# Patient Record
Sex: Male | Born: 1945 | Hispanic: No | Marital: Married | Smoking: Never smoker
Health system: Southern US, Community
[De-identification: ages and names within clinical notes are randomized; demographics above are authoritative.]

## PROBLEM LIST (undated history)

## (undated) DIAGNOSIS — Q5564 Hidden penis: Secondary | ICD-10-CM

## (undated) DIAGNOSIS — M1990 Unspecified osteoarthritis, unspecified site: Secondary | ICD-10-CM

## (undated) DIAGNOSIS — I4891 Unspecified atrial fibrillation: Secondary | ICD-10-CM

## (undated) DIAGNOSIS — G5600 Carpal tunnel syndrome, unspecified upper limb: Secondary | ICD-10-CM

## (undated) DIAGNOSIS — M109 Gout, unspecified: Secondary | ICD-10-CM

## (undated) DIAGNOSIS — H548 Legal blindness, as defined in USA: Secondary | ICD-10-CM

## (undated) DIAGNOSIS — E7800 Pure hypercholesterolemia, unspecified: Secondary | ICD-10-CM

## (undated) DIAGNOSIS — K219 Gastro-esophageal reflux disease without esophagitis: Secondary | ICD-10-CM

## (undated) DIAGNOSIS — F32A Depression, unspecified: Secondary | ICD-10-CM

## (undated) DIAGNOSIS — E669 Obesity, unspecified: Secondary | ICD-10-CM

## (undated) DIAGNOSIS — F329 Major depressive disorder, single episode, unspecified: Secondary | ICD-10-CM

## (undated) DIAGNOSIS — I10 Essential (primary) hypertension: Secondary | ICD-10-CM

## (undated) HISTORY — PX: TONSILLECTOMY: SUR1361

## (undated) HISTORY — DX: Unspecified atrial fibrillation: I48.91

## (undated) HISTORY — DX: Carpal tunnel syndrome, unspecified upper limb: G56.00

## (undated) HISTORY — PX: INGUINAL HERNIA REPAIR: SHX194

## (undated) HISTORY — DX: Essential (primary) hypertension: I10

## (undated) HISTORY — DX: Major depressive disorder, single episode, unspecified: F32.9

## (undated) HISTORY — DX: Gout, unspecified: M10.9

## (undated) HISTORY — DX: Unspecified osteoarthritis, unspecified site: M19.90

## (undated) HISTORY — DX: Pure hypercholesterolemia, unspecified: E78.00

## (undated) HISTORY — DX: Legal blindness, as defined in USA: H54.8

## (undated) HISTORY — DX: Depression, unspecified: F32.A

## (undated) HISTORY — DX: Gastro-esophageal reflux disease without esophagitis: K21.9

## (undated) HISTORY — DX: Obesity, unspecified: E66.9

---

## 1999-02-20 ENCOUNTER — Ambulatory Visit (HOSPITAL_BASED_OUTPATIENT_CLINIC_OR_DEPARTMENT_OTHER): Admission: RE | Admit: 1999-02-20 | Discharge: 1999-02-20 | Payer: Self-pay

## 1999-02-27 ENCOUNTER — Ambulatory Visit (HOSPITAL_COMMUNITY): Admission: RE | Admit: 1999-02-27 | Discharge: 1999-02-27 | Payer: Self-pay

## 2005-07-10 ENCOUNTER — Encounter: Admission: RE | Admit: 2005-07-10 | Discharge: 2005-07-10 | Payer: Self-pay | Admitting: Family Medicine

## 2005-07-10 ENCOUNTER — Inpatient Hospital Stay (HOSPITAL_COMMUNITY): Admission: EM | Admit: 2005-07-10 | Discharge: 2005-07-27 | Payer: Self-pay | Admitting: Emergency Medicine

## 2005-07-12 ENCOUNTER — Ambulatory Visit: Payer: Self-pay | Admitting: Pulmonary Disease

## 2006-12-06 ENCOUNTER — Observation Stay (HOSPITAL_COMMUNITY): Admission: EM | Admit: 2006-12-06 | Discharge: 2006-12-07 | Payer: Self-pay | Admitting: Emergency Medicine

## 2013-06-20 ENCOUNTER — Encounter: Payer: Self-pay | Admitting: Interventional Cardiology

## 2013-06-20 ENCOUNTER — Encounter: Payer: Self-pay | Admitting: *Deleted

## 2013-06-24 ENCOUNTER — Encounter: Payer: Self-pay | Admitting: Interventional Cardiology

## 2013-06-24 ENCOUNTER — Encounter (INDEPENDENT_AMBULATORY_CARE_PROVIDER_SITE_OTHER): Payer: Self-pay

## 2013-06-24 ENCOUNTER — Ambulatory Visit (INDEPENDENT_AMBULATORY_CARE_PROVIDER_SITE_OTHER): Payer: Medicare Other | Admitting: Interventional Cardiology

## 2013-06-24 VITALS — BP 160/80 | HR 89 | Ht 67.0 in | Wt 326.0 lb

## 2013-06-24 DIAGNOSIS — Z7901 Long term (current) use of anticoagulants: Secondary | ICD-10-CM

## 2013-06-24 DIAGNOSIS — E785 Hyperlipidemia, unspecified: Secondary | ICD-10-CM

## 2013-06-24 DIAGNOSIS — I10 Essential (primary) hypertension: Secondary | ICD-10-CM | POA: Insufficient documentation

## 2013-06-24 DIAGNOSIS — I4891 Unspecified atrial fibrillation: Secondary | ICD-10-CM

## 2013-06-24 DIAGNOSIS — I482 Chronic atrial fibrillation, unspecified: Secondary | ICD-10-CM

## 2013-06-24 NOTE — Patient Instructions (Signed)
Your physician recommends that you continue on your current medications as directed. Please refer to the Current Medication list given to you today.  Your physician recommends that you schedule a follow-up appointment in: 1 year with Dr. Smith.   

## 2013-06-24 NOTE — Progress Notes (Signed)
Patient ID: Ryan Barker, male   DOB: 1946-03-12, 67 y.o.   MRN: [id number]    HPI He is accompanied by his wife. He has no complaints. He advocates compliance with his medical regimen. He is very sedentary. Sedentary lifestyle is due to severe arthritis in both knees. He has gained weight since his last office visit. He denies difficulty with sleep. He has had no episodes of syncope, or near-syncope. No medication side effects.  No Known Allergies  Current Outpatient Prescriptions  Medication Sig Dispense Refill  . allopurinol (ZYLOPRIM) 300 MG tablet Take 300 mg by mouth daily.      Marland Kitchen ALPRAZolam (XANAX) 1 MG tablet Take 1 mg by mouth at bedtime as needed for sleep.      Marland Kitchen diltiazem (CARDIZEM) 60 MG tablet Take 2 tablets by mouth 3 (three) times daily.      Marland Kitchen lisinopril (PRINIVIL,ZESTRIL) 40 MG tablet Take 40 mg by mouth daily.      . metoprolol tartrate (LOPRESSOR) 25 MG tablet Take 25 mg by mouth 2 (two) times daily.      . Multiple Vitamins-Minerals (ICAPS MV PO) Take 1 tablet by mouth daily.      Marland Kitchen omeprazole (PRILOSEC) 40 MG capsule Take 40 mg by mouth daily.      . pravastatin (PRAVACHOL) 40 MG tablet Take 1 tablet by mouth daily.      Marland Kitchen torsemide (DEMADEX) 20 MG tablet Take 20 mg by mouth daily.      Marland Kitchen warfarin (COUMADIN) 5 MG tablet Take 5 mg by mouth as directed.       No current facility-administered medications for this visit.    Past Medical History  Diagnosis Date  . Gout   . HTN (hypertension)   . Depressive disorder   . Atrial fibrillation   . Osteoarthritis   . Obesity   . GERD (gastroesophageal reflux disease)   . Hypercholesteremia   . Carpal tunnel syndrome   . Legally blind in left eye, as defined in [place]   . Hypercholesteremia     No past surgical history on file.  No family history on file.  History   Social History  . Marital Status: Married    Spouse Name: N/A    Number of Children: N/A  . Years of Education: N/A   Occupational History  .  Not on file.   Social History Main Topics  . Smoking status: Never Smoker   . Smokeless tobacco: Not on file  . Alcohol Use: No  . Drug Use: No  . Sexual Activity: Not on file   Other Topics Concern  . Not on file   Social History Narrative  . No narrative on file    ROS: Denies chest pain, syncope, angina, orthopnea, bleeding, and, transient neurological symptoms. Does have chronic lower extremity swelling, improved compared to prior evaluations.  PHYSICAL EXAM BP 160/80  Pulse 89  Ht 5\' 7"  (1.702 m)  Wt 326 lb (147.873 kg)  BMI 51.05 kg/m2  Sitting a wheelchair. No acute distress. Skin is clear. No cyanosis noted. HEENT exam reveals ptosis of the left lid. Lungs are clear to auscultation and percussion Cardiac exam reveals an irregularly irregular rhythm. No murmur or gallop is heard. Marked abdominal obesity. No tenderness. Bowel sounds are normal. 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 Left lid ptosis  EKG: Atrial fibrillation, with controlled ventricular response, right bundle branch block, left anterior hemiblock  ASSESSMENT AND PLAN  1. Chronic atrial fibrillation, with good.good rate control.  2. Chronic anticoagulation therapy for stroke prevention. This is followed by his primary physician, Dr. Manus Gunning  3. Hypertension, controlled.  4. Hyperlipidemia.  5. Morbid obesity, and near immobility, do to osteoarthritis of both knees  In summary, the patient has no acute abnormalities. He has not had bleeding complications. The atrial fibrillation. Rate control is adequate. His major comorbidity is obesity. Have recommended caloric control and physical activity as much as possible.

## 2013-07-16 ENCOUNTER — Other Ambulatory Visit: Payer: Self-pay

## 2014-06-06 ENCOUNTER — Encounter: Payer: Self-pay | Admitting: *Deleted

## 2014-06-25 ENCOUNTER — Other Ambulatory Visit: Payer: Self-pay

## 2014-06-29 ENCOUNTER — Encounter: Payer: Self-pay | Admitting: Interventional Cardiology

## 2014-06-29 ENCOUNTER — Ambulatory Visit (INDEPENDENT_AMBULATORY_CARE_PROVIDER_SITE_OTHER): Payer: Medicare Other | Admitting: Interventional Cardiology

## 2014-06-29 VITALS — BP 150/78 | HR 69 | Ht 68.0 in | Wt 314.0 lb

## 2014-06-29 DIAGNOSIS — Z7901 Long term (current) use of anticoagulants: Secondary | ICD-10-CM

## 2014-06-29 DIAGNOSIS — I482 Chronic atrial fibrillation, unspecified: Secondary | ICD-10-CM

## 2014-06-29 DIAGNOSIS — I10 Essential (primary) hypertension: Secondary | ICD-10-CM

## 2014-06-29 NOTE — Progress Notes (Signed)
Patient ID: Ryan Barker Defeo, male   DOB: November 11, 1945, 68 y.o.   MRN: [id number]    [address] [place], [place]  [address] Phone: [phone] Fax:  [phone]  Date:  06/29/2014   ID:  Ryan ShihWilliam Barker Barker, DOB November 11, 1945, MRN [id number]  PCP:  Thora LanceEHINGER,ROBERT R, MD   ASSESSMENT:  1. Chronic atrial fibrillation 2. Anticoagulation therapy without bleeding 3. Hypertension 4. Morbid obesity  PLAN:  1. Change to diltiazem dosing from 3 tablets twice per day to 2 tablets 3 times per day as listed in the mid list. 2. [facility] 3. Call if syncope, palpitations, dyspnea, or chest pain.   SUBJECTIVE: Ryan Barker is a 68 y.o. male is becoming increasingly immobile related to obesity and arthritis. No falls or head trauma. Denies syncope. Lower extremity swelling has resolved. He denies orthopnea. No angina.   Wt Readings from Last 3 Encounters:  06/29/14 314 lb (142.429 kg)  06/24/13 326 lb (147.873 kg)     Past Medical History  Diagnosis Date  . Gout   . HTN (hypertension)   . Depressive disorder   . Atrial fibrillation   . Osteoarthritis   . Obesity   . GERD (gastroesophageal reflux disease)   . Hypercholesteremia   . Carpal tunnel syndrome   . Legally blind in left eye, as defined in [place]   . Hypercholesteremia     Current Outpatient Prescriptions  Medication Sig Dispense Refill  . acetaminophen (TYLENOL) 650 MG CR tablet Take 1,300 mg by mouth every 8 (eight) hours as needed for pain.      Marland Kitchen. allopurinol (ZYLOPRIM) 300 MG tablet Take 300 mg by mouth daily.      Marland Kitchen. ALPRAZolam (XANAX) 1 MG tablet Take 1 mg by mouth at bedtime as needed for sleep.      Marland Kitchen. diltiazem (CARDIZEM) 60 MG tablet Take 2 tablets by mouth 3 (three) times daily.      Marland Kitchen. HYDROcodone-acetaminophen (NORCO/VICODIN) 5-325 MG per tablet       . lisinopril (PRINIVIL,ZESTRIL) 40 MG tablet Take 40 mg by mouth daily.      . meloxicam (MOBIC) 15 MG tablet Take 15 mg by  mouth daily.      . metoprolol tartrate (LOPRESSOR) 25 MG tablet Take 25 mg by mouth 2 (two) times daily.      Marland Kitchen. omeprazole (PRILOSEC) 40 MG capsule Take 40 mg by mouth daily.      . pravastatin (PRAVACHOL) 40 MG tablet Take 1 tablet by mouth daily.      Marland Kitchen. torsemide (DEMADEX) 20 MG tablet Take 20 mg by mouth daily.      Marland Kitchen. warfarin (COUMADIN) 5 MG tablet Take 5 mg by mouth as directed.       No current facility-administered medications for this visit.    Allergies:   No Known Allergies  Social History:  The patient  reports that he has never smoked. He does not have any smokeless tobacco history on file. He reports that he does not drink alcohol or use illicit drugs.   ROS:  Please see the history of present illness.   No transient neurological complaints   All other systems reviewed and negative.   OBJECTIVE: VS:  BP 150/78  Pulse 69  Ht 5\' 8"  (1.727 m)  Wt 314 lb (142.429 kg)  BMI 47.75 kg/m2 Well nourished, well developed, in no acute distress, morbid obesity HEENT: normal Neck: JVD flat. Carotid bruit absent  Cardiac:  normal S1, S2; IIRR; no murmur Lungs:  clear to auscultation bilaterally, no wheezing, rhonchi or rales Abd: soft, nontender, no hepatomegaly Ext: Edema absent. He does have chronic lichenification of the skin. Pulses 2+ Skin: warm and dry Neuro:  CNs 2-12 intact, no focal abnormalities noted  EKG:  Atrial fibrillation with right bundle branch block and left axis deviation unchanged from prior tracings       Signed, Darci NeedleHenry Barker. B. Tranquilino Fischler III, MD 06/29/2014 1:49 PM

## 2014-06-29 NOTE — Patient Instructions (Signed)
Your physician recommends that you continue on your current medications as directed. Please refer to the Current Medication list given to you today.  Your physician wants you to follow-up in: 1 year with Dr.Smith You will receive a reminder letter in the mail two months in advance. If you don't receive a letter, please call our office to schedule the follow-up appointment.  

## 2015-03-07 ENCOUNTER — Other Ambulatory Visit: Payer: Self-pay

## 2015-07-05 ENCOUNTER — Encounter: Payer: Self-pay | Admitting: Cardiology

## 2015-07-05 ENCOUNTER — Ambulatory Visit (INDEPENDENT_AMBULATORY_CARE_PROVIDER_SITE_OTHER): Payer: Medicare Other | Admitting: Cardiology

## 2015-07-05 VITALS — BP 142/84 | HR 71 | Ht 68.0 in | Wt 308.8 lb

## 2015-07-05 DIAGNOSIS — I10 Essential (primary) hypertension: Secondary | ICD-10-CM

## 2015-07-05 DIAGNOSIS — I482 Chronic atrial fibrillation, unspecified: Secondary | ICD-10-CM

## 2015-07-05 NOTE — Patient Instructions (Addendum)
Medication Instructions:  Your physician recommends that you continue on your current medications as directed. Please refer to the Current Medication list given to you today.   Labwork: None ordered  Testing/Procedures: None ordered  Follow-Up: Your physician wants you to follow-up in: 1 YEAR WITH DR. Katrinka BlazingSMITH.  You will receive a reminder letter in the mail two months in advance. If you don't receive a letter, please call our office to schedule the follow-up appointment.    Any Other Special Instructions Will Be Listed Below (If Applicable). 1.  START walking:  1st week do 1 lap a day                                   2nd week do 2 laps a day                                   3rd week do 4 laps a day                                   4th week do 6 laps a day and continue on til you can reach 10 laps a day                                    TV SHOW:  BETTER LATER THAN NEVER                                   If you need a refill on your cardiac medications before your next appointment, please call your pharmacy.

## 2015-07-05 NOTE — Progress Notes (Signed)
Cardiology Office Note   Date:  07/05/2015   ID:  Ryan Barker, Ryan Barker December 10, 1945, MRN [id number]  PCP:  Thora Lance, MD  Cardiologist:  Dr. Katrinka Blazing    Chief Complaint  Patient presents with  . Atrial Fibrillation    no pain      History of Present Illness: Ryan Barker is a 69 y.o. male who presents for chronic a fib and hx obesity, HTN, hyperlipidemia.   Today he denies chest pain,  No SOB.  He is not aware of his heart rate.   He does not exercise, basically  Watches TV all day. He has lost some wt.  He is only eating one meal a day.  We discussed eating at least a small meal for breakfast.  He does use salt shaker, discussed with pt and his wife to use less salt.        Past Medical History  Diagnosis Date  . Gout   . HTN (hypertension)   . Depressive disorder   . Atrial fibrillation (HCC)   . Osteoarthritis   . Obesity   . GERD (gastroesophageal reflux disease)   . Hypercholesteremia   . Carpal tunnel syndrome   . Legally blind in left eye, as defined in [place]   . Hypercholesteremia     History reviewed. No pertinent past surgical history.   Current Outpatient Prescriptions  Medication Sig Dispense Refill  . acetaminophen (TYLENOL) 650 MG CR tablet Take 1,300 mg by mouth every 8 (eight) hours as needed for pain.    Marland Kitchen allopurinol (ZYLOPRIM) 300 MG tablet Take 300 mg by mouth daily.    Marland Kitchen ALPRAZolam (XANAX) 1 MG tablet Take 1 mg by mouth at bedtime as needed for sleep.    Marland Kitchen diltiazem (CARDIZEM) 60 MG tablet Take 2 tablets by mouth 3 (three) times daily.    . Glucosamine-Chondroitin (MOVE FREE PO) Take 1 tablet by mouth daily.    Marland Kitchen lisinopril (PRINIVIL,ZESTRIL) 40 MG tablet Take 40 mg by mouth daily.    . meloxicam (MOBIC) 15 MG tablet Take 15 mg by mouth daily.    . metoprolol tartrate (LOPRESSOR) 25 MG tablet Take 25 mg by mouth 2 (two) times daily.    Marland Kitchen omeprazole (PRILOSEC) 40 MG capsule Take 40 mg by mouth daily.    . pravastatin (PRAVACHOL) 40 MG  tablet Take 40 mg by mouth daily.    Marland Kitchen torsemide (DEMADEX) 20 MG tablet Take 20 mg by mouth daily.    Marland Kitchen warfarin (COUMADIN) 5 MG tablet Take 5 mg by mouth as directed.     No current facility-administered medications for this visit.    Allergies:   Review of patient's allergies indicates no known allergies.    Social History:  The patient  reports that he has never smoked. He does not have any smokeless tobacco history on file. He reports that he does not drink alcohol or use illicit drugs.   Family History:  The patient's family history is not on file.  Parents are deceased.  ROS:  General:no colds or fevers, gradual weight loss Skin:no rashes or ulcers HEENT:no blurred vision, no congestion CV:see HPI PUL:see HPI GI:no diarrhea constipation or melena, no indigestion- had one episode of bright blood in his stool none since GU:no hematuria, no dysuria MS:+ joint pain knees that is why difficult to walk, no claudication Neuro:no syncope, no lightheadedness Endo:no diabetes, no thyroid disease  Wt Readings from Last 3 Encounters:  07/05/15 308 lb 12.8 oz (  140.071 kg)  06/29/14 314 lb (142.429 kg)  06/24/13 326 lb (147.873 kg)     PHYSICAL EXAM: VS:  BP 142/84 mmHg  Pulse 71  Ht 5\' 8"  (1.727 m)  Wt 308 lb 12.8 oz (140.071 kg)  BMI 46.96 kg/m2 , BMI Body mass index is 46.96 kg/(m^2). General:Pleasant affect, NAD Skin:Warm and dry, brisk capillary refill HEENT:normocephalic, sclera clear, mucus membranes moist Neck:supple, no JVD, no bruits  Heart:S1S2 RRR without murmur, gallup, rub or click Lungs:clear without rales, rhonchi, or wheezes JXB:JYNWAbd:soft, non tender, + BS, do not palpate liver spleen or masses Ext:1+ lower ext edema,  2+ radial pulses Neuro:alert and oriented, MAE, follows commands, + facial symmetry    EKG:  EKG is ordered today. The ekg ordered today demonstrates a fib, LAD, RBBB no acute changes.    Recent Labs: No results found for requested labs within  last 365 days.    Lipid Panel No results found for: CHOL, TRIG, HDL, CHOLHDL, VLDL, LDLCALC, LDLDIRECT     Other studies Reviewed: Additional studies/ records that were reviewed today include: previous notes.   ASSESSMENT AND PLAN:  1.  Chronic atrial fibrillation stable, no bleeding on coumadin, [facility].  Follow up with Dr. Katrinka BlazingSmith in 1 year.  2. Anticoagulation therapy without bleeding  3. Hypertension controlled  4. Morbid obesity slow wt loss, discussed activity.  Plan to work up to 10 laps in his home.    Current medicines are reviewed with the patient today.  The patient Has no concerns regarding medicines.  The following changes have been made:  See above Labs/ tests ordered today include:see above  Disposition:   FU:  see above  Signed, Leone BrandINGOLD,Coriann Brouhard R, NP  07/05/2015 3:24 PM    [facility] [address], [place], [place]  [address]/ [address] [place], [place] Phone: [phone]; Fax: [phone]  [phone]

## 2015-10-23 ENCOUNTER — Emergency Department (HOSPITAL_COMMUNITY)
Admission: EM | Admit: 2015-10-23 | Discharge: 2015-10-25 | Disposition: A | Discharge status: Home / Self Care | Payer: Medicare Other | Attending: Emergency Medicine | Admitting: Emergency Medicine

## 2015-10-23 ENCOUNTER — Encounter (HOSPITAL_COMMUNITY): Payer: Self-pay | Admitting: *Deleted

## 2015-10-23 DIAGNOSIS — F0280 Dementia in other diseases classified elsewhere without behavioral disturbance: Secondary | ICD-10-CM | POA: Diagnosis present

## 2015-10-23 DIAGNOSIS — M1990 Unspecified osteoarthritis, unspecified site: Secondary | ICD-10-CM | POA: Diagnosis not present

## 2015-10-23 DIAGNOSIS — R5381 Other malaise: Secondary | ICD-10-CM

## 2015-10-23 DIAGNOSIS — E7800 Pure hypercholesterolemia, unspecified: Secondary | ICD-10-CM | POA: Insufficient documentation

## 2015-10-23 DIAGNOSIS — Z7389 Other problems related to life management difficulty: Secondary | ICD-10-CM | POA: Diagnosis not present

## 2015-10-23 DIAGNOSIS — K644 Residual hemorrhoidal skin tags: Secondary | ICD-10-CM | POA: Insufficient documentation

## 2015-10-23 DIAGNOSIS — M109 Gout, unspecified: Secondary | ICD-10-CM | POA: Insufficient documentation

## 2015-10-23 DIAGNOSIS — Q5564 Hidden penis: Secondary | ICD-10-CM | POA: Diagnosis not present

## 2015-10-23 DIAGNOSIS — I499 Cardiac arrhythmia, unspecified: Secondary | ICD-10-CM | POA: Diagnosis not present

## 2015-10-23 DIAGNOSIS — H548 Legal blindness, as defined in USA: Secondary | ICD-10-CM | POA: Diagnosis not present

## 2015-10-23 DIAGNOSIS — K219 Gastro-esophageal reflux disease without esophagitis: Secondary | ICD-10-CM | POA: Insufficient documentation

## 2015-10-23 DIAGNOSIS — R634 Abnormal weight loss: Secondary | ICD-10-CM | POA: Diagnosis not present

## 2015-10-23 DIAGNOSIS — Z79899 Other long term (current) drug therapy: Secondary | ICD-10-CM | POA: Insufficient documentation

## 2015-10-23 DIAGNOSIS — Z7901 Long term (current) use of anticoagulants: Secondary | ICD-10-CM | POA: Insufficient documentation

## 2015-10-23 DIAGNOSIS — I4891 Unspecified atrial fibrillation: Secondary | ICD-10-CM | POA: Insufficient documentation

## 2015-10-23 DIAGNOSIS — I10 Essential (primary) hypertension: Secondary | ICD-10-CM | POA: Insufficient documentation

## 2015-10-23 DIAGNOSIS — F03918 Unspecified dementia, unspecified severity, with other behavioral disturbance: Secondary | ICD-10-CM | POA: Diagnosis present

## 2015-10-23 DIAGNOSIS — F0391 Unspecified dementia with behavioral disturbance: Secondary | ICD-10-CM | POA: Diagnosis present

## 2015-10-23 DIAGNOSIS — E669 Obesity, unspecified: Secondary | ICD-10-CM | POA: Insufficient documentation

## 2015-10-23 DIAGNOSIS — F329 Major depressive disorder, single episode, unspecified: Secondary | ICD-10-CM | POA: Diagnosis not present

## 2015-10-23 DIAGNOSIS — F1310 Sedative, hypnotic or anxiolytic abuse, uncomplicated: Secondary | ICD-10-CM | POA: Insufficient documentation

## 2015-10-23 DIAGNOSIS — F0393 Unspecified dementia, unspecified severity, with mood disturbance: Secondary | ICD-10-CM | POA: Diagnosis present

## 2015-10-23 DIAGNOSIS — R6889 Other general symptoms and signs: Secondary | ICD-10-CM

## 2015-10-23 DIAGNOSIS — Z008 Encounter for other general examination: Secondary | ICD-10-CM | POA: Diagnosis present

## 2015-10-23 DIAGNOSIS — Z791 Long term (current) use of non-steroidal anti-inflammatories (NSAID): Secondary | ICD-10-CM | POA: Insufficient documentation

## 2015-10-23 HISTORY — DX: Hidden penis: Q55.64

## 2015-10-23 LAB — URINE MICROSCOPIC-ADD ON: RBC / HPF: NONE SEEN RBC/hpf (ref 0–5)

## 2015-10-23 LAB — CBC WITH DIFFERENTIAL/PLATELET
BASOS ABS: 0 10*3/uL (ref 0.0–0.1)
BASOS PCT: 0 %
EOS ABS: 0.1 10*3/uL (ref 0.0–0.7)
Eosinophils Relative: 1 %
HCT: 42.7 % (ref 39.0–52.0)
Hemoglobin: 14.4 g/dL (ref 13.0–17.0)
Lymphocytes Relative: 18 %
Lymphs Abs: 1.3 10*3/uL (ref 0.7–4.0)
MCH: 32.6 pg (ref 26.0–34.0)
MCHC: 33.7 g/dL (ref 30.0–36.0)
MCV: 96.6 fL (ref 78.0–100.0)
MONO ABS: 0.6 10*3/uL (ref 0.1–1.0)
MONOS PCT: 8 %
NEUTROS PCT: 73 %
Neutro Abs: 5.4 10*3/uL (ref 1.7–7.7)
Platelets: 207 10*3/uL (ref 150–400)
RBC: 4.42 MIL/uL (ref 4.22–5.81)
RDW: 14.8 % (ref 11.5–15.5)
WBC: 7.4 10*3/uL (ref 4.0–10.5)

## 2015-10-23 LAB — BASIC METABOLIC PANEL
BUN: 21 mg/dL (ref 4–21)
Creatinine: 1.3 mg/dL (ref <=1.3)
GLUCOSE: 106 mg/dL
Sodium: 140 mmol/L (ref 137–147)

## 2015-10-23 LAB — RAPID URINE DRUG SCREEN, HOSP PERFORMED
Amphetamines: NOT DETECTED
BARBITURATES: NOT DETECTED
BENZODIAZEPINES: POSITIVE — AB
COCAINE: NOT DETECTED
Opiates: NOT DETECTED
Tetrahydrocannabinol: NOT DETECTED

## 2015-10-23 LAB — HEPATIC FUNCTION PANEL
ALK PHOS: 90 U/L (ref 25–125)
ALT: 10 U/L (ref 10–40)
AST: 22 U/L (ref 14–40)
BILIRUBIN, TOTAL: 1.3 mg/dL

## 2015-10-23 LAB — COMPREHENSIVE METABOLIC PANEL
ALBUMIN: 4.2 g/dL (ref 3.5–5.0)
ALT: 10 U/L — ABNORMAL LOW (ref 17–63)
AST: 22 U/L (ref 15–41)
Alkaline Phosphatase: 90 U/L (ref 38–126)
Anion gap: 14 (ref 5–15)
BUN: 21 mg/dL — AB (ref 6–20)
CALCIUM: 9.2 mg/dL (ref 8.9–10.3)
CO2: 25 mmol/L (ref 22–32)
CREATININE: 1.27 mg/dL — AB (ref 0.61–1.24)
Chloride: 101 mmol/L (ref 101–111)
GFR calc Af Amer: >60 mL/min (ref >60)
GFR calc non Af Amer: 55 mL/min — ABNORMAL LOW (ref >60)
GLUCOSE: 106 mg/dL — AB (ref 65–99)
Potassium: 3.5 mmol/L (ref 3.5–5.1)
SODIUM: 140 mmol/L (ref 135–145)
TOTAL PROTEIN: 7.1 g/dL (ref 6.5–8.1)
Total Bilirubin: 1.3 mg/dL — ABNORMAL HIGH (ref 0.3–1.2)

## 2015-10-23 LAB — URINALYSIS, ROUTINE W REFLEX MICROSCOPIC
Glucose, UA: NEGATIVE mg/dL
HGB URINE DIPSTICK: NEGATIVE
Ketones, ur: NEGATIVE mg/dL
Nitrite: NEGATIVE
PROTEIN: NEGATIVE mg/dL
SPECIFIC GRAVITY, URINE: 1.021 (ref 1.005–1.030)
pH: 5 (ref 5.0–8.0)

## 2015-10-23 LAB — PROTIME-INR
INR: 2.86 — AB (ref 0.00–1.49)
Prothrombin Time: 29.5 seconds — ABNORMAL HIGH (ref 11.6–15.2)

## 2015-10-23 LAB — ACETAMINOPHEN LEVEL

## 2015-10-23 LAB — POC OCCULT BLOOD, ED: FECAL OCCULT BLD: NEGATIVE

## 2015-10-23 LAB — SALICYLATE LEVEL

## 2015-10-23 LAB — ETHANOL: Alcohol, Ethyl (B): <5 mg/dL (ref <5)

## 2015-10-23 LAB — CBC AND DIFFERENTIAL: WBC: 7.4 10^3/mL

## 2015-10-23 MED ORDER — WARFARIN SODIUM 5 MG PO TABS
5.0000 mg | ORAL_TABLET | ORAL | Status: DC
  Filled 2015-10-23: qty 1

## 2015-10-23 MED ORDER — METOPROLOL TARTRATE 25 MG PO TABS
25.0000 mg | ORAL_TABLET | Freq: Two times a day (BID) | ORAL | Status: DC
  Administered 2015-10-23 – 2015-10-25 (×4): 25 mg via ORAL
  Filled 2015-10-23 (×4): qty 1

## 2015-10-23 MED ORDER — DILTIAZEM HCL 60 MG PO TABS
180.0000 mg | ORAL_TABLET | Freq: Two times a day (BID) | ORAL | Status: DC
  Administered 2015-10-24 – 2015-10-25 (×4): 180 mg via ORAL
  Filled 2015-10-23 (×6): qty 3

## 2015-10-23 MED ORDER — LISINOPRIL 20 MG PO TABS
40.0000 mg | ORAL_TABLET | Freq: Every day | ORAL | Status: DC
  Administered 2015-10-24 – 2015-10-25 (×2): 40 mg via ORAL
  Filled 2015-10-23 (×3): qty 2

## 2015-10-23 MED ORDER — WARFARIN SODIUM 2.5 MG PO TABS: 2.5000 mg | ORAL_TABLET | Freq: Every day | ORAL | Status: DC

## 2015-10-23 MED ORDER — WARFARIN - PHYSICIAN DOSING INPATIENT: Freq: Every day | Status: DC

## 2015-10-23 MED ORDER — PRAVASTATIN SODIUM 40 MG PO TABS
40.0000 mg | ORAL_TABLET | Freq: Every day | ORAL | Status: DC
  Administered 2015-10-24 – 2015-10-25 (×2): 40 mg via ORAL
  Filled 2015-10-23 (×3): qty 1

## 2015-10-23 MED ORDER — ALLOPURINOL 300 MG PO TABS
300.0000 mg | ORAL_TABLET | Freq: Every day | ORAL | Status: DC
  Administered 2015-10-24 – 2015-10-25 (×2): 300 mg via ORAL
  Filled 2015-10-23 (×3): qty 1

## 2015-10-23 MED ORDER — WARFARIN SODIUM 2.5 MG PO TABS
2.5000 mg | ORAL_TABLET | ORAL | Status: DC
  Administered 2015-10-23 – 2015-10-24 (×2): 2.5 mg via ORAL
  Filled 2015-10-23 (×3): qty 1

## 2015-10-23 MED ORDER — MELOXICAM 15 MG PO TABS
15.0000 mg | ORAL_TABLET | Freq: Every day | ORAL | Status: DC
  Administered 2015-10-24 – 2015-10-25 (×2): 15 mg via ORAL
  Filled 2015-10-23 (×3): qty 1

## 2015-10-23 MED ORDER — PANTOPRAZOLE SODIUM 40 MG PO TBEC
40.0000 mg | DELAYED_RELEASE_TABLET | Freq: Every day | ORAL | Status: DC
  Administered 2015-10-24 – 2015-10-25 (×2): 40 mg via ORAL
  Filled 2015-10-23 (×2): qty 1

## 2015-10-23 MED ORDER — TORSEMIDE 20 MG PO TABS
20.0000 mg | ORAL_TABLET | Freq: Every day | ORAL | Status: DC | PRN
  Filled 2015-10-23: qty 1

## 2015-10-23 NOTE — ED Notes (Signed)
Will do manual BP's on pt as monitor has been consistently wrong when re checked manually.

## 2015-10-23 NOTE — B[facility] Counselor (Signed)
Consulted with Christen Bame, NP who recommended a.m. Psych eval. follow up with possible case management. Notified pt. Nurse at [facility] ED of disposition as well as that pt. And wife have directive and will through [facility] attorney, and wife stated would provide at later time. Mylia Pondexter K. Jesus Genera, [facility]  Counselor 10/23/2015 10:40 PM

## 2015-10-23 NOTE — ED Notes (Signed)
Per Ptar, pt is IVC c/d SI/HI, spouse stated he has had rectal bleeding as evidence by blood on the sheets, weight loss of 30-40's in 30 days, has had trouble urinating but was incontinent of urine when entering the home, he takes 12 benadryl every day, has hx of A-fib, spouse thinks he has the beginnings of Alzheimer's, his penis is inside him.

## 2015-10-23 NOTE — [facility] Counselor (Signed)
Called [facility] ER nurse[ order cart for tele psych, spoke with ART, states that will let nurse know of call for tele psych cart. 2032 on 10/23/15 Carrah Eppolito K. Sherlon Handing, LPC-A, [facility]  Counselor 10/23/2015 8:39 PM

## 2015-10-23 NOTE — ED Notes (Signed)
Pt is alert & oriented to day Wynelle Link), year ('17), place ([facility]) and Economist ([employer]).  Pt stated "I was trying to lose some weight and gave up sweets mainly.  I noticed bleeding one day from my rectum.  I checked it and there was blood on the tp.  I checked it the next time I had to go and there was none and there's been none since."

## 2015-10-23 NOTE — ED Notes (Signed)
Bed: ZO10 Expected date:  Expected time:  Means of arrival:  Comments: EMS- IVC, [employer]

## 2015-10-23 NOTE — [facility] Counselor (Signed)
Called [facility] ER spoke with Art, tele psych cart was not up yet states he would remind nurses.  Windie Marasco K. Jesus Genera, [facility]  Counselor 10/23/2015 8:43 PM

## 2015-10-23 NOTE — ED Notes (Signed)
Pt stating "I've had not said I wanted to hurt myself or anyone else but Wednesday when my wife had her breakdown, she said 'I wish I was dead' and I repeated it back to her & told her 'no' she didn't.  I plan on living to be 89 as long as I can get around a little better than I am now."

## 2015-10-23 NOTE — ED Provider Notes (Signed)
CSN: [id number]     Arrival date & time 10/23/15  1711 History   First MD Initiated Contact with Patient 10/23/15 1740     Chief Complaint  Patient presents with  . Psychiatric Evaluation    IVC    HPI   Ryan Barker is a 70 y.o. male with a PMH of HTN, HLD, GERD, atrial fibrillation on coumadin, who was brought in by family members to the ED under IVC for poor PO intake, weight loss, and refusal to take care of himself at home or allow his wife to take care of him. Family members also note intermittent rectal bleeding over the past several months. The patient denies complaints, though states he feels betrayed by his family. He denies fever, chills, chest pain, shortness of breath, abdominal pain, N/V/D/C, dysuria, urgency, frequency, numbness, weakness, paresthesia, drug use, alcohol use, homicidal ideation, suicidal ideation, hallucinations. Family members state they are concerned he has early alzheimer's, and brought him in to be evaluated.   Past Medical History  Diagnosis Date  . Gout   . HTN (hypertension)   . Depressive disorder   . Atrial fibrillation (HCC)   . Osteoarthritis   . Obesity   . GERD (gastroesophageal reflux disease)   . Hypercholesteremia   . Carpal tunnel syndrome   . Legally blind in left eye, as defined in [place]   . Hypercholesteremia   . 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  They did surgery on that 15 yrs ago."   Past Surgical History  Procedure Laterality Date  . Tonsillectomy    . Inguinal hernia repair     No family history on file. Social History  Substance Use Topics  . Smoking status: Never Smoker   . Smokeless tobacco: None  . Alcohol Use: No      Review of Systems  Constitutional: Negative for fever and chills.  Eyes: Negative for visual disturbance.  Respiratory: Negative for shortness of breath.   Cardiovascular: Negative for chest pain.  Gastrointestinal: Positive for anal bleeding. Negative for  nausea, vomiting, abdominal pain, diarrhea and constipation.  Genitourinary: Negative for dysuria, urgency and frequency.  Neurological: Negative for dizziness, syncope, weakness, light-headedness, numbness and headaches.  Psychiatric/Behavioral: Negative for suicidal ideas, hallucinations and confusion.  All other systems reviewed and are negative.     Allergies  Review of patient's allergies indicates no known allergies.  Home Medications   Prior to Admission medications   Medication Sig Start Date End Date Taking? Authorizing Provider  acetaminophen (TYLENOL) 500 MG tablet Take 500-2,000 mg by mouth 2 (two) times daily as needed for moderate pain.   Yes Historical Provider, MD  allopurinol (ZYLOPRIM) 300 MG tablet Take 300 mg by mouth daily.   Yes Historical Provider, MD  ALPRAZolam Prudy Feeler) 1 MG tablet Take 1 mg by mouth 3 (three) times daily as needed for anxiety or sleep.    Yes Historical Provider, MD  diltiazem (CARDIZEM) 60 MG tablet Take 180 mg by mouth 2 (two) times daily.  04/06/13  Yes Historical Provider, MD  lisinopril (PRINIVIL,ZESTRIL) 40 MG tablet Take 40 mg by mouth daily.   Yes Historical Provider, MD  meloxicam (MOBIC) 15 MG tablet Take 15 mg by mouth daily.   Yes Historical Provider, MD  metoprolol tartrate (LOPRESSOR) 25 MG tablet Take 25 mg by mouth 2 (two) times daily.   Yes Historical Provider, MD  omeprazole (PRILOSEC) 40 MG capsule Take 40 mg  by mouth daily.   Yes Historical Provider, MD  pravastatin (PRAVACHOL) 40 MG tablet Take 40 mg by mouth daily.   Yes Historical Provider, MD  torsemide (DEMADEX) 20 MG tablet Take 20 mg by mouth daily as needed (for swelling).    Yes Historical Provider, MD  warfarin (COUMADIN) 5 MG tablet Take 2.5-5 mg by mouth daily. Take 5mg  qd on Tuesdays and Fridays, and take 2.5mg  qd on Monday, Wednesday, Thursday , Saturday and Sunday   Yes Historical Provider, MD    BP 145/80 mmHg  Pulse 98  Temp(Src) 98.1 F (36.7 C) (Oral)   Resp 14  SpO2 96% Physical Exam  Constitutional: He is oriented to person, place, and time. He appears well-developed and well-nourished. No distress.  HENT:  Head: Normocephalic and atraumatic.  Right Ear: External ear normal.  Left Ear: External ear normal.  Nose: Nose normal.  Mouth/Throat: Uvula is midline, oropharynx is clear and moist and mucous membranes are normal.  Eyes: Conjunctivae, EOM and lids are normal. Pupils are equal, round, and reactive to light. Right eye exhibits no discharge. Left eye exhibits no discharge. No scleral icterus.  Neck: Normal range of motion. Neck supple.  Cardiovascular: Normal rate, normal heart sounds, intact distal pulses and normal pulses.  An irregularly irregular rhythm present.  Pulmonary/Chest: Effort normal and breath sounds normal. No respiratory distress. He has no wheezes. He has no rales.  Abdominal: Soft. Normal appearance and bowel sounds are normal. He exhibits no distension and no mass. There is no tenderness. There is no rigidity, no rebound and no guarding.  Genitourinary: Rectal exam shows external hemorrhoid. Rectal exam shows no internal hemorrhoid, no fissure, no mass, no tenderness and anal tone normal. Guaiac negative stool.  External hemorrhoid present on exam. No evidence of thrombosis. No significant TTP.  Musculoskeletal: Normal range of motion. He exhibits no edema or tenderness.  Neurological: He is alert and oriented to person, place, and time. He has normal strength. No cranial nerve deficit or sensory deficit.  Skin: Skin is warm, dry and intact. No rash noted. He is not diaphoretic. No erythema. No pallor.  Psychiatric: He has a normal mood and affect. His speech is normal and behavior is normal. He expresses no homicidal and no suicidal ideation. He expresses no suicidal plans and no homicidal plans.  Nursing note and vitals reviewed.   ED Course  Procedures (including critical care time)  Labs Review Labs Reviewed   COMPREHENSIVE METABOLIC PANEL - Abnormal; Notable for the following:    Glucose, Bld 106 (*)    BUN 21 (*)    Creatinine, Ser 1.27 (*)    ALT 10 (*)    Total Bilirubin 1.3 (*)    GFR calc non Af Amer 55 (*)    All other components within normal limits  URINE RAPID DRUG SCREEN, HOSP PERFORMED - Abnormal; Notable for the following:    Benzodiazepines POSITIVE (*)    All other components within normal limits  URINALYSIS, ROUTINE W REFLEX MICROSCOPIC (NOT AT [facility]) - Abnormal; Notable for the following:    Bilirubin Urine SMALL (*)    Leukocytes, UA TRACE (*)    All other components within normal limits  PROTIME-INR - Abnormal; Notable for the following:    Prothrombin Time 29.5 (*)    INR 2.86 (*)    All other components within normal limits  ACETAMINOPHEN LEVEL - Abnormal; Notable for the following:    Acetaminophen (Tylenol), Serum <10 (*)    All  other components within normal limits  URINE MICROSCOPIC-ADD ON - Abnormal; Notable for the following:    Squamous Epithelial / LPF 0-5 (*)    Bacteria, UA FEW (*)    Casts GRANULAR CAST (*)    All other components within normal limits  URINE CULTURE  CBC WITH DIFFERENTIAL/PLATELET  ETHANOL  SALICYLATE LEVEL  PROTIME-INR  POC OCCULT BLOOD, ED    Imaging Review No results found.   I have personally reviewed and evaluated these lab results as part of my medical decision-making.   EKG Interpretation None      MDM   Final diagnoses:  Alteration in self-care ability    70 year old male presents with his family members under IVC for poor PO intake, weight loss, and refusal to take care of himself at home or allow his wife to take care of him. Patient denies complaints, though states he feels betrayed by his family. Denies fever, chills, chest pain, shortness of breath, abdominal pain, N/V/D/C, dysuria, urgency, frequency, numbness, weakness, paresthesia, drug use, alcohol use, homicidal ideation, suicidal ideation,  hallucinations. Family members state they are concerned he has early alzheimer's.  Patient is afebrile. Vital signs stable. Heart irregularly irregular. Lungs clear to auscultation bilaterally. Abdomen soft, nontender, nondistended. Normal neuro exam with no focal deficit.  EKG atrial fibrillation, heart rate 107. CBC negative for leukocytosis or anemia. CMP remarkable for creatinine 1.27. INR 2.86. UA trace leukocytes, 6-30 WBC, few bacteria. Urine culture ordered. UDS, acetaminophen, salicylate, ethanol negative. Hemoccult negative.  TTS consulted given patient's refusal to care of himself at home and history of depression. Per [facility] assessment, patient will have AM psych evaluation. Patient discussed with Dr. Ranae Palms.  BP 145/80 mmHg  Pulse 98  Temp(Src) 98.1 F (36.7 C) (Oral)  Resp 14  SpO2 96%    Mady Gemma, PA-C 10/24/15 0035  Loren Racer, MD 10/26/15 [phone]

## 2015-10-23 NOTE — BH Assessment (Signed)
Tele Assessment Note   Ryan Barker is an 70 y.o. male, Caucasian, married who presents to [employer] under IVC from family for poor PO intake, weight loss, and refusal to take care of himself at home or allow his wife to take care of him. Family members also note intermittent rectal bleeding over the past several months. The patient denies complaints, though states he feels betrayed by his family. Patient identifies anxiety as primary concern. Wife present during assessment, and states that she is worried about him , pt. At times takes up to 12 benadryl at a time, and refuses at times help with care at home. Wife also states that pt has stated in the past [towards wife] that , " I wish you would drive off the road and get killed." Pt. Wife also states that pt has lost up to 30 lbs. During the past 1-2 months, and pt. Did not dispute this statement. Patient states that he does take a medication for anxiety 3x per day, and last anxiety attack was this present date, and the attacks were random, but controlled via medication. Patient states that he is concerned about current care , and that after his last wife passed that there was an agreement made for his care in [place] where he was registered under [facility] , but he has not been able to change or receive this care/ benefit in [place]. Patient was concerned that current wife does not agree to moving to [place].   Patient denies current or past history of SI/HI, as well as AVH. 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 Patient denies any hx. Of inpatient or outpatient psychiatric care. Ryan Bible states that he does have a primary care dr.  Dr. Gaylan Barker at [facility], and a "heart Dr." Ryan Barker, followed by orthopedic Dr. At [facility]. Patient currently lives with wife who provides care for him as needed. Patient sleep is varied throughout day and night with no report of recent  disturbances, and as much as 8-1 0 hours sleep total within a given day. Patient reports he is currently retired.   Patient is dressed in hospital gown and is alert and oriented x4. Patient speech was within normal limits and motor behavior appeared normal. Patient thought process is coherent. Patient  does not appear to be responding to internal stimuli. Patient was cooperative throughout the assessment and states that he is not agreeable to inpatient psychiatric treatment.   Diagnosis: 300.00 [F41.9] Unspecified Anxiety Disorder   Past Medical History:  Past Medical History  Diagnosis Date  . Gout   . HTN (hypertension)   . Depressive disorder   . Atrial fibrillation (HCC)   . Osteoarthritis   . Obesity   . GERD (gastroesophageal reflux disease)   . Hypercholesteremia   . Carpal tunnel syndrome   . Legally blind in left eye, as defined in [place]   . Hypercholesteremia   . 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  They did surgery on that 15 yrs ago."    Past Surgical History  Procedure Laterality Date  . Tonsillectomy    . Inguinal hernia repair      Family History: No family history on file.  Social History:  reports that he has never smoked. He does not have any smokeless tobacco history on file. He reports that he does not drink alcohol  or use illicit drugs.  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 Negative Consequences of Use: Personal relationships, Financial Withdrawal Symptoms: Patient aware of relationship between substance abuse and physical/medical complications Substance #1 Name of Substance 1: alcohol 1 - Age of First Use: unspecified 1 - Amount (size/oz): unspecified 1 -  Frequency: unspecified 1 - Duration: years 1 - Last Use / Amount: 14 years to date accoring to pt.  CIWA: CIWA-Ar BP: 189/90 mmHg Pulse Rate: 89 COWS:    PATIENT STRENGTHS: (choose at least two) Active sense of humor Average or above average intelligence Communication skills  Allergies: No Known Allergies  Home Medications:  (Not in a hospital admission)  OB/GYN Status:  No LMP for male patient.  General Assessment Data Location of Assessment: [facility] TTS Assessment: In system Is this a Tele or Face-to-Face Assessment?: Tele Assessment Is this an Initial Assessment or a Re-assessment for this encounter?: Initial Assessment Marital status: Married [place] name: NA Is patient pregnant?: No Pregnancy Status: Unknown Living Arrangements: Spouse/significant other Can pt return to current living arrangement?: Yes Admission Status: Involuntary Is patient capable of signing voluntary admission?: Yes Referral Source: Self/Family/Friend Insurance type: medicare     Crisis Care Plan Living Arrangements: Spouse/significant other Name of Psychiatrist: none Name of Therapist: none  Education Status Is patient currently in school?: No Current Grade: NA Highest grade of school patient has completed: unspecified Name of school: unknown Contact person: Ryan Barker, PT WIFE  Risk to self with the past 6 months Suicidal Ideation: No Has patient been a risk to self within the past 6 months prior to admission? : No Suicidal Intent: No Has patient had any suicidal intent within the past 6 months prior to admission? : No Is patient at risk for suicide?: No Suicidal Plan?: No Has patient had any suicidal plan within the past 6 months prior to admission? : No Access to Means: No What has been your use of drugs/alcohol within the last 12 months?: RECIVERY/ SOBRIERTY 14 YEARS Previous Attempts/Gestures: No How many times?: 0 Other Self Harm Risks: NONE Triggers for Past Attempts: None  known Intentional Self Injurious Behavior: None Family Suicide History: Yes Recent stressful life event(s): Recent negative physical changes, Trauma (Comment), Turmoil (Comment) (physical issues, argumentative with wife over care) Persecutory voices/beliefs?: No Depression: Yes Depression Symptoms: Loss of interest in usual pleasures, Feeling worthless/self pity, Fatigue Substance abuse history and/or treatment for substance abuse?: No Suicide prevention information given to non-admitted patients: Not applicable  Risk to Others within the past 6 months Homicidal Ideation: No Does patient have any lifetime risk of violence toward others beyond the six months prior to admission? : No Thoughts of Harm to Others: No Current Homicidal Intent: No Current Homicidal Plan: No Access to Homicidal Means: No Identified Victim: NA History of harm to others?: No Assessment of Violence: None Noted Violent Behavior Description: None noted Does patient have access to weapons?: No Criminal Charges Pending?: No Does patient have a court date: No Is patient on probation?: No  Psychosis Hallucinations: None noted Delusions: None noted  Mental Status Report Appearance/Hygiene: In hospital gown Eye Contact: Good Motor Activity: Unremarkable Speech: Unremarkable Level of Consciousness: Alert Mood: Depressed, Irritable Affect: Preoccupied, Irritable, Appropriate to circumstance, Apprehensive Anxiety Level: Panic  Attacks Panic attack frequency: random Most recent panic attack: 10/23/15 Thought Processes: Coherent, Relevant Judgement: Unimpaired Orientation: Person, Place, Time, Situation, Appropriate for developmental age Obsessive Compulsive Thoughts/Behaviors: None  Cognitive Functioning Concentration: Normal Memory: Recent Intact, Remote Intact IQ: Average Insight: Good Impulse Control: Good Appetite: Fair Weight Loss: 30 Weight Gain: 0 Sleep: No Change Total Hours of Sleep: 8 (sleep  is in small patterns through day and night) Vegetative Symptoms: Staying in bed, Not bathing, Decreased grooming  ADLScreening [facility] Assessment Services) Patient's cognitive ability adequate to safely complete daily activities?: Yes Patient able to express need for assistance with ADLs?: Yes Independently performs ADLs?: Yes (appropriate for developmental age) (some assistance needed due to orthopedic issues)  Prior Inpatient Therapy Prior Inpatient Therapy: No Prior Therapy Dates: NA Prior Therapy Facilty/Provider(s): none Reason for Treatment: NA  Prior Outpatient Therapy Prior Outpatient Therapy: No Prior Therapy Dates: NA Prior Therapy Facilty/Provider(s): NA Reason for Treatment: NA Does patient have an ACCT team?: No Does patient have Intensive In-House Services?  : No Does patient have [facility] services? : No Does patient have P4CC services?: No  ADL Screening (condition at time of admission) Patient's cognitive ability adequate to safely complete daily activities?: Yes Is the patient deaf or have difficulty hearing?: No Does the patient have difficulty seeing, even when wearing glasses/contacts?: Yes (pt states needs to make new appointtmnet with dr. to check on eyesight) Does the patient have difficulty concentrating, remembering, or making decisions?: No Patient able to express need for assistance with ADLs?: Yes Does the patient have difficulty dressing or bathing?: Yes (pt needs some asst due to orthopedic issues. some help with dressing) Independently performs ADLs?: Yes (appropriate for developmental age) (some assistance needed due to orthopedic issues) Does the patient have difficulty walking or climbing stairs?: Yes (knee problems/ orthopedic) Weakness of Legs: Both (orthopedic problems) Weakness of Arms/Hands: Both (some weakness/ arthritis)  Home Assistive Devices/Equipment Home Assistive Devices/Equipment: Cane (specify quad or straight) (pt. has both . has a  "hurri cane".)    Abuse/Neglect Assessment (Assessment to be complete while patient is alone) Physical Abuse: Denies Verbal Abuse: Denies Sexual Abuse: Denies Exploitation of patient/patient's resources: Denies Self-Neglect: Denies Values / Beliefs Cultural Requests During Hospitalization: None Spiritual Requests During Hospitalization: None   Advance Directives (For Healthcare) Does patient have an advance directive?: Yes Type of Advance Directive: Living will, Healthcare Power of Attorney (pt states has with attorney through Medicare) Does patient want to make changes to advanced directive?: No - Patient declined Copy of advanced directive(s) in chart?: No - copy requested (notified wife and pt. wife states can provide at later time)    Additional Information 1:1 In Past 12 Months?: No CIRT Risk: No Elopement Risk: No Does patient have medical clearance?: Yes     Disposition: Per Christen Bame, NP a.m. Psych eval, and if needed case management. Disposition Initial Assessment Completed for this Encounter: Yes Disposition of Patient: Other dispositions (TBD upon consult with extender)  Hipolito Bayley 10/23/2015 9:59 PM

## 2015-10-24 DIAGNOSIS — F03918 Unspecified dementia, unspecified severity, with other behavioral disturbance: Secondary | ICD-10-CM | POA: Diagnosis present

## 2015-10-24 DIAGNOSIS — R6889 Other general symptoms and signs: Secondary | ICD-10-CM | POA: Insufficient documentation

## 2015-10-24 DIAGNOSIS — R5381 Other malaise: Secondary | ICD-10-CM | POA: Insufficient documentation

## 2015-10-24 DIAGNOSIS — F0391 Unspecified dementia with behavioral disturbance: Secondary | ICD-10-CM | POA: Diagnosis not present

## 2015-10-24 DIAGNOSIS — F329 Major depressive disorder, single episode, unspecified: Secondary | ICD-10-CM

## 2015-10-24 DIAGNOSIS — F0393 Unspecified dementia, unspecified severity, with mood disturbance: Secondary | ICD-10-CM | POA: Diagnosis present

## 2015-10-24 DIAGNOSIS — F0280 Dementia in other diseases classified elsewhere without behavioral disturbance: Secondary | ICD-10-CM | POA: Diagnosis present

## 2015-10-24 LAB — PROTIME-INR
INR: 2.95 — AB (ref 0.00–1.49)
PROTHROMBIN TIME: 30.2 s — AB (ref 11.6–15.2)

## 2015-10-24 MED ORDER — CITALOPRAM HYDROBROMIDE 10 MG PO TABS
10.0000 mg | ORAL_TABLET | Freq: Every day | ORAL | Status: DC
  Administered 2015-10-24 – 2015-10-25 (×2): 10 mg via ORAL
  Filled 2015-10-24 (×2): qty 1

## 2015-10-24 MED ORDER — ACETAMINOPHEN 325 MG PO TABS: 650.0000 mg | ORAL_TABLET | ORAL | Status: DC | PRN

## 2015-10-24 MED ORDER — ONDANSETRON HCL 4 MG PO TABS: 4.0000 mg | ORAL_TABLET | Freq: Three times a day (TID) | ORAL | Status: DC | PRN

## 2015-10-24 MED ORDER — ALUM & MAG HYDROXIDE-SIMETH 200-200-20 MG/5ML PO SUSP: 30.0000 mL | ORAL | Status: DC | PRN

## 2015-10-24 NOTE — Evaluation (Signed)
Physical Therapy Evaluation Patient Details Name: Ryan Barker MRN: [id number] DOB: 06/18/1946 Today's Date: 10/24/2015   History of Present Illness  Ryan Barker is a 70 y.o. male with a PMH of HTN, HLD, GERD, atrial fibrillation on coumadin, who was brought in by family members to the ED 2/12/17under IVC for poor PO intake, weight loss, and refusal to take care of himself at home or allow his wife to take care of him.  The patient denies complaints, though states he feels betrayed by his family. . Family members state they are concerned he has early alzheimer's, and brought him in to be evaluated  Clinical Impression  Patient was able to stand and pivot steps to [facility] and back to bed with RW. Patient expressing concern for Insurance covering any rehab and prefers to return home. Wife present and expresses concern for being able to provide care. Patient  Reports that he did go down the steps  When EMS arrived. Patient will benefit from PT while in the hospital to improve to return to prior level of function to DC home vs SNF.     Follow Up Recommendations Home health PT;SNF;Supervision/Assistance - 24 hour (if patient will consent, he  expresses conncern for it being covered by insurance.)    Equipment Recommendations  None recommended by PT    Recommendations for Other Services       Precautions / Restrictions Precautions Precautions: Fall      Mobility  Bed Mobility Overal bed mobility: Needs Assistance Bed Mobility: Supine to Sit;Sit to Supine     Supine to sit: Min guard Sit to supine: Min guard   General bed mobility comments: moves self to edge of bwed, momentum to swing legs onto bed.  Transfers Overall transfer level: Needs assistance Equipment used: Rolling walker (2 wheeled) Transfers: Sit to/from [employer] Sit to Stand: +2 safety/equipment;Mod assist Stand pivot transfers: Mod assist;+2 safety/equipment       General transfer comment:  stands from the bed and BSC with momentum, easier from raised bed. took steps from bed to [facility], 180* turn then back to bed after a rest on BSC.  Ambulation/Gait Ambulation/Gait assistance: +2 safety/equipment              Stairs            Wheelchair Mobility    Modified Rankin (Stroke Patients Only)       Balance                                             Pertinent Vitals/Pain Pain Assessment: No/denies pain    Home Living Family/patient expects to be discharged to:: Private residence Living Arrangements: Spouse/significant other Available Help at Discharge: Family Type of Home: House Home Access: Stairs to enter Entrance Stairs-Rails: Right Entrance Stairs-Number of Steps: 4 Home Layout: One level Home Equipment: Walker - 4 wheels;Cane - single point      Prior Function Level of Independence: Independent with assistive device(s)               Hand Dominance        Extremity/Trunk Assessment   Upper Extremity Assessment: Generalized weakness;RUE deficits/detail;LUE deficits/detail RUE Deficits / Details: decreased shoulder elevation, able to push off , audible crepitus     LUE Deficits / Details: similar to R, crepitus   Lower Extremity Assessment: Generalized weakness;RLE  deficits/detail;LLE deficits/detail RLE Deficits / Details: knee crepitus audible.  LLE Deficits / Details: same as R     Communication   Communication: No difficulties  Cognition Arousal/Alertness: Awake/alert Behavior During Therapy: WFL for tasks assessed/performed Overall Cognitive Status: Within Functional Limits for tasks assessed                      General Comments      Exercises        Assessment/Plan    PT Assessment Patient needs continued PT services  PT Diagnosis Difficulty walking;Generalized weakness   PT Problem List Decreased strength;Decreased range of motion;Decreased activity tolerance;Decreased  balance;Decreased mobility;Decreased safety awareness;Decreased knowledge of use of DME;Decreased knowledge of precautions  PT Treatment Interventions DME instruction;Gait training;Functional mobility training;Therapeutic activities;Therapeutic exercise;Patient/family education   PT Goals (Current goals can be found in the Care Plan section) Acute Rehab PT Goals Patient Stated Goal: to go home PT Goal Formulation: With patient/family Time For Goal Achievement: 11/07/15 Potential to Achieve Goals: Good    Frequency Min 3X/week   Barriers to discharge Decreased caregiver support      Co-evaluation               End of Session Equipment Utilized During Treatment: Gait belt Activity Tolerance: Patient tolerated treatment well Patient left: in bed;with call bell/phone within reach;with family/visitor present Nurse Communication: Mobility status    Functional Assessment Tool Used: clinical judgement Functional Limitation: Mobility: Walking and moving around Mobility: Walking and Moving Around Current Status [phone]): At least 40 percent but less than 60 percent impaired, limited or restricted Mobility: Walking and Moving Around Goal Status [phone]): At least 1 percent but less than 20 percent impaired, limited or restricted    Time: 3086-5784 PT Time Calculation (min) (ACUTE ONLY): 18 min   Charges:   PT Evaluation $PT Eval Low Complexity: 1 Procedure     PT G Codes:   PT G-Codes **NOT FOR INPATIENT CLASS** Functional Assessment Tool Used: clinical judgement Functional Limitation: Mobility: Walking and moving around Mobility: Walking and Moving Around Current Status (O9629): At least 40 percent but less than 60 percent impaired, limited or restricted Mobility: Walking and Moving Around Goal Status [phone]): At least 1 percent but less than 20 percent impaired, limited or restricted    Rada Hay 10/24/2015, 2:49 PM Blanchard Kelch PT [phone]

## 2015-10-24 NOTE — Consult Note (Signed)
[place] Psychiatry Consult   Reason for Consult:  Depression, change in cognitive states Referring Physician:  EDP Patient Identification: Ryan Barker MRN:  [id number] Principal Diagnosis: Dementia with behavioral disturbance Diagnosis:   Patient Active Problem List   Diagnosis Date Noted  . Dementia with behavioral disturbance [F03.91] 10/24/2015    Priority: High  . 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 Chronic atrial fibrillation (HCC) [I48.2] 06/24/2013    Class: Chronic  . Long term current use of anticoagulant therapy [Z79.01] 06/24/2013  . Other and unspecified hyperlipidemia [E78.5] 06/24/2013  . Essential hypertension, benign [I10] 06/24/2013    Total Time spent with patient: 45 minutes  Subjective:   Ryan Barker is a 70 y.o. male patient medication changed and will await geropsychiatry unless he improves.  HPI:  On admission: 70 y.o. male, Caucasian, married who presents to [employer] under IVC from family for poor PO intake, weight loss, and refusal to take care of himself at home or allow his wife to take care of him. Family members also note intermittent rectal bleeding over the past several months. The patient denies complaints, though states he feels betrayed by his family. Patient identifies anxiety as primary concern. Wife present during assessment, and states that she is worried about him , pt. At times takes up to 12 benadryl at a time, and refuses at times help with care at home. Wife also states that pt has stated in the past [towards wife] that , " I wish you would drive off the road and get killed." Pt. Wife also states that pt has lost up to 30 lbs. During the past 1-2 months, and pt. Did not dispute this statement. Patient states that he does take a medication for anxiety 3x per day, and last anxiety attack was this present date, and the attacks were random, but controlled via medication. Patient states that he  is concerned about current care , and that after his last wife passed that there was an agreement made for his care in [place] where he was registered under [place] care , but he has not been able to change or receive this care/ benefit in [place]. Patient was concerned that current wife does not agree to moving to [place].   Patient denies current or past history of SI/HI, as well as AVH. 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 Patient denies any hx. Of inpatient or outpatient psychiatric care. [name] states that he does have a primary care dr. Dr. Valli Glance at [facility], and a "heart Dr." Dr.Smith, followed by orthopedic Dr. At [facility]. Patient currently lives with wife who provides care for him as needed. Patient sleep is varied throughout day and night with no report of recent disturbances, and as much as 8-1 0 hours sleep total within a given day. Patient reports he is currently retired.   ON assessment:  Patient denied suicidal/homicidal ideations, no past attempts, no hallucinations, and no alcohol/drug use.  He does show evidence of dementia type symptoms, not bathing or taking care of his ADLs.   He has been taking Xanax for years up to four times a day PRN anxiety which may be clouding his memory at this time.  Past Psychiatric History: NOne  Risk to Self: Suicidal Ideation: No Suicidal Intent: No Is patient at risk for suicide?: No Suicidal Plan?: No Access to Means:  No What has been your use of drugs/alcohol within the last 12 months?: RECIVERY/ [place]BRIERTY 14 YEARS How many times?: 0 Other Self Harm Risks: NONE Triggers for Past Attempts: None known Intentional Self Injurious Behavior: None Risk to Others: Homicidal Ideation: No Thoughts of Harm to Others: No Current Homicidal Intent: No Current Homicidal Plan: No Access to Homicidal Means: No Identified Victim: NA History of harm to others?:  No Assessment of Violence: None Noted Violent Behavior Description: None noted Does patient have access to weapons?: No Criminal Charges Pending?: No Does patient have a court date: No Prior Inpatient Therapy: Prior Inpatient Therapy: No Prior Therapy Dates: NA Prior Therapy Facilty/Provider(s): none Reason for Treatment: NA Prior Outpatient Therapy: Prior Outpatient Therapy: No Prior Therapy Dates: NA Prior Therapy Facilty/Provider(s): NA Reason for Treatment: NA Does patient have an ACCT team?: No Does patient have Intensive In-House Services?  : No Does patient have [facility] services? : No Does patient have P4CC services?: No  Past Medical History:  Past Medical History  Diagnosis Date  . Gout   . HTN (hypertension)   . Depressive disorder   . Atrial fibrillation ([place])   . Osteoarthritis   . Obesity   . GERD (gastroesophageal reflux disease)   . Hypercholesteremia   . Carpal tunnel syndrome   . Legally blind in left eye, as defined in [place]   . Hypercholesteremia   . 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  They did surgery on that 15 yrs ago."    Past Surgical History  Procedure Laterality Date  . Tonsillectomy    . Inguinal hernia repair     Family History: No family history on file. Family Psychiatric  History: NOne Social History:  History  Alcohol Use No     History  Drug Use No    Social History   Social History  . Marital Status: Married    Spouse Name: N/A  . Number of Children: N/A  . Years of Education: N/A   Social History Main Topics  . Smoking status: Never Smoker   . Smokeless tobacco: None  . Alcohol Use: No  . Drug Use: No  . Sexual Activity: Not Asked   Other Topics Concern  . None   Social History Narrative   Additional Social History:    Allergies:  No Known Allergies  Labs:  Results for orders placed or performed during the hospital encounter of 10/23/15 (from the past 48 hour(s))  CBC  with Differential     Status: None   Collection Time: 10/23/15  6:37 PM  Result Value Ref Range   WBC 7.4 4.0 - 10.5 K/uL   RBC 4.42 4.22 - 5.81 MIL/uL   Hemoglobin 14.4 13.0 - 17.0 g/dL   HCT 42.7 39.0 - 52.0 %   MCV 96.6 78.0 - 100.0 fL   MCH 32.6 26.0 - 34.0 pg   MCHC 33.7 30.0 - 36.0 g/dL   RDW 14.8 11.5 - 15.5 %   Platelets 207 150 - 400 K/uL   Neutrophils Relative % 73 %   Neutro Abs 5.4 1.7 - 7.7 K/uL   Lymphocytes Relative 18 %   Lymphs Abs 1.3 0.7 - 4.0 K/uL   Monocytes Relative 8 %   Monocytes Absolute 0.6 0.1 - 1.0 K/uL   Eosinophils Relative 1 %   Eosinophils Absolute 0.1 0.0 - 0.7 K/uL   Basophils Relative 0 %   Basophils Absolute 0.0 0.0 -  0.1 K/uL  Comprehensive metabolic panel     Status: Abnormal   Collection Time: 10/23/15  6:37 PM  Result Value Ref Range   Sodium 140 135 - 145 mmol/L   Potassium 3.5 3.5 - 5.1 mmol/L   Chloride 101 101 - 111 mmol/L   CO2 25 22 - 32 mmol/L   Glucose, Bld 106 (H) 65 - 99 mg/dL   BUN 21 (H) 6 - 20 mg/dL   Creatinine, Ser 1.27 (H) 0.61 - 1.24 mg/dL   Calcium 9.2 8.9 - 10.3 mg/dL   Total Protein 7.1 6.5 - 8.1 g/dL   Albumin 4.2 3.5 - 5.0 g/dL   AST 22 15 - 41 U/L   ALT 10 (L) 17 - 63 U/L   Alkaline Phosphatase 90 38 - 126 U/L   Total Bilirubin 1.3 (H) 0.3 - 1.2 mg/dL   GFR calc non Af Amer 55 (L) >60 mL/min   GFR calc Af Amer >60 >60 mL/min    Comment: (NOTE) The eGFR has been calculated using the CKD EPI equation. This calculation has not been validated in all clinical situations. eGFR's persistently <60 mL/min signify possible Chronic Kidney Disease.    Anion gap 14 5 - 15  Ethanol     Status: None   Collection Time: 10/23/15  6:37 PM  Result Value Ref Range   Alcohol, Ethyl (B) <5 <5 mg/dL    Comment:        LOWEST DETECTABLE LIMIT FOR SERUM ALCOHOL IS 5 mg/dL FOR MEDICAL PURPOSES ONLY   Protime-INR     Status: Abnormal   Collection Time: 10/23/15  6:37 PM  Result Value Ref Range   Prothrombin Time 29.5 (H)  11.6 - 15.2 seconds   INR 2.86 (H) 0.76 - 8.08  Salicylate level     Status: None   Collection Time: 10/23/15  6:38 PM  Result Value Ref Range   Salicylate Lvl <8.1 2.8 - 30.0 mg/dL  Acetaminophen level     Status: Abnormal   Collection Time: 10/23/15  6:38 PM  Result Value Ref Range   Acetaminophen (Tylenol), Serum <10 (L) 10 - 30 ug/mL    Comment:        THERAPEUTIC CONCENTRATIONS VARY SIGNIFICANTLY. A RANGE OF 10-30 ug/mL MAY BE AN EFFECTIVE CONCENTRATION FOR MANY PATIENTS. HOWEVER, SOME ARE BEST TREATED AT CONCENTRATIONS OUTSIDE THIS RANGE. ACETAMINOPHEN CONCENTRATIONS >150 ug/mL AT 4 HOURS AFTER INGESTION AND >50 ug/mL AT 12 HOURS AFTER INGESTION ARE OFTEN ASSOCIATED WITH TOXIC REACTIONS.   POC occult blood, ED Provider will collect     Status: None   Collection Time: 10/23/15  8:00 PM  Result Value Ref Range   Fecal Occult Bld NEGATIVE NEGATIVE  Urine rapid drug screen (hosp performed)     Status: Abnormal   Collection Time: 10/23/15 10:42 PM  Result Value Ref Range   Opiates NONE DETECTED NONE DETECTED   Cocaine NONE DETECTED NONE DETECTED   Benzodiazepines POSITIVE (A) NONE DETECTED   Amphetamines NONE DETECTED NONE DETECTED   Tetrahydrocannabinol NONE DETECTED NONE DETECTED   Barbiturates NONE DETECTED NONE DETECTED    Comment:        DRUG SCREEN FOR MEDICAL PURPOSES ONLY.  IF CONFIRMATION IS NEEDED FOR ANY PURPOSE, NOTIFY LAB WITHIN 5 DAYS.        LOWEST DETECTABLE LIMITS FOR URINE DRUG SCREEN Drug Class       Cutoff (ng/mL) Amphetamine      1000 Barbiturate      200 Benzodiazepine  732 Tricyclics       202 Opiates          300 Cocaine          300 THC              50   Urinalysis, Routine w reflex microscopic (not at [facility])     Status: Abnormal   Collection Time: 10/23/15 10:42 PM  Result Value Ref Range   Color, Urine YELLOW YELLOW   APPearance CLEAR CLEAR   Specific Gravity, Urine 1.021 1.005 - 1.030   pH 5.0 5.0 - 8.0   Glucose, UA NEGATIVE  NEGATIVE mg/dL   Hgb urine dipstick NEGATIVE NEGATIVE   Bilirubin Urine SMALL (A) NEGATIVE   Ketones, ur NEGATIVE NEGATIVE mg/dL   Protein, ur NEGATIVE NEGATIVE mg/dL   Nitrite NEGATIVE NEGATIVE   Leukocytes, UA TRACE (A) NEGATIVE  Urine microscopic-add on     Status: Abnormal   Collection Time: 10/23/15 10:42 PM  Result Value Ref Range   Squamous Epithelial / LPF 0-5 (A) NONE SEEN   WBC, UA 6-30 0 - 5 WBC/hpf   RBC / HPF NONE SEEN 0 - 5 RBC/hpf   Bacteria, UA FEW (A) NONE SEEN   Casts GRANULAR CAST (A) NEGATIVE  Protime-INR     Status: Abnormal   Collection Time: 10/24/15  5:02 AM  Result Value Ref Range   Prothrombin Time 30.2 (H) 11.6 - 15.2 seconds   INR 2.95 (H) 0.00 - 1.49    Current Facility-Administered Medications  Medication Dose Route Frequency Provider Last Rate Last Dose  . acetaminophen (TYLENOL) tablet 650 mg  650 mg Oral Q4H PRN Marella Chimes, PA-C      . allopurinol (ZYLOPRIM) tablet 300 mg  300 mg Oral Daily Marella Chimes, PA-C   300 mg at 10/24/15 1100  . alum & mag hydroxide-simeth (MAALOX/MYLANTA) 200-200-20 MG/5ML suspension 30 mL  30 mL Oral PRN Marella Chimes, PA-C      . diltiazem (CARDIZEM) tablet 180 mg  180 mg Oral BID Marella Chimes, PA-C   180 mg at 10/24/15 1059  . lisinopril (PRINIVIL,ZESTRIL) tablet 40 mg  40 mg Oral Daily Marella Chimes, PA-C   40 mg at 10/24/15 1059  . meloxicam (MOBIC) tablet 15 mg  15 mg Oral Daily Marella Chimes, PA-C   15 mg at 10/24/15 1059  . metoprolol tartrate (LOPRESSOR) tablet 25 mg  25 mg Oral BID Marella Chimes, PA-C   25 mg at 10/24/15 1100  . ondansetron (ZOFRAN) tablet 4 mg  4 mg Oral Q8H PRN Marella Chimes, PA-C      . pantoprazole (PROTONIX) EC tablet 40 mg  40 mg Oral Daily Marella Chimes, PA-C   40 mg at 10/24/15 1100  . pravastatin (PRAVACHOL) tablet 40 mg  40 mg Oral Daily Marella Chimes, PA-C   40 mg at 10/24/15 1100  . torsemide (DEMADEX) tablet 20  mg  20 mg Oral Daily PRN Marella Chimes, PA-C      . warfarin (COUMADIN) tablet 2.5 mg  2.5 mg Oral Once per day on Sun Mon Wed Thu Sat Julianne Rice, MD   2.5 mg at 10/24/15 1810  . [START ON 10/25/2015] warfarin (COUMADIN) tablet 5 mg  5 mg Oral Once per day on Tue Fri Julianne Rice, MD      . Warfarin - Physician Dosing Inpatient   Does not apply [id number] Julianne Rice, MD  Current Outpatient Prescriptions  Medication Sig Dispense Refill  . acetaminophen (TYLENOL) 500 MG tablet Take 500-2,000 mg by mouth 2 (two) times daily as needed for moderate pain.    Marland Kitchen allopurinol (ZYLOPRIM) 300 MG tablet Take 300 mg by mouth daily.    Marland Kitchen ALPRAZolam (XANAX) 1 MG tablet Take 1 mg by mouth 3 (three) times daily as needed for anxiety or sleep.     Marland Kitchen diltiazem (CARDIZEM) 60 MG tablet Take 180 mg by mouth 2 (two) times daily.     Marland Kitchen lisinopril (PRINIVIL,ZESTRIL) 40 MG tablet Take 40 mg by mouth daily.    . meloxicam (MOBIC) 15 MG tablet Take 15 mg by mouth daily.    . metoprolol tartrate (LOPRESSOR) 25 MG tablet Take 25 mg by mouth 2 (two) times daily.    Marland Kitchen omeprazole (PRILOSEC) 40 MG capsule Take 40 mg by mouth daily.    . pravastatin (PRAVACHOL) 40 MG tablet Take 40 mg by mouth daily.    Marland Kitchen torsemide (DEMADEX) 20 MG tablet Take 20 mg by mouth daily as needed (for swelling).     . warfarin (COUMADIN) 5 MG tablet Take 2.5-5 mg by mouth daily. Take 59m qd on Tuesdays and Fridays, and take 2.574mqd on Monday, Wednesday, Thursday , Saturday and Sunday      Musculoskeletal: Strength & Muscle Tone: decreased Gait & Station: unsteady Patient leans: N/A  Psychiatric Specialty Exam: Review of Systems  Constitutional: Negative.   HENT: Negative.   Eyes: Negative.   Respiratory: Negative.   Cardiovascular: Negative.   Gastrointestinal: Negative.   Genitourinary: Negative.   Musculoskeletal: Negative.   Skin: Negative.   Neurological: Negative.   Endo/Heme/Allergies: Negative.    Psychiatric/Behavioral: Positive for depression, suicidal ideas and memory loss. The patient is nervous/anxious.     Blood pressure 148/92, pulse 88, temperature 98.6 F (37 C), temperature source Oral, resp. rate 18, SpO2 95 %.There is no weight on file to calculate BMI.  General Appearance: Disheveled  Eye Contact::  Good  Speech:  Normal Rate  Volume:  Normal  Mood:  Irritable  Affect:  Congruent  Thought Process:  Coherent  Orientation:  Full (Time, Place, and Person)  Thought Content:  Rumination  Suicidal Thoughts:  No  Homicidal Thoughts:  No  Memory:  Immediate;   Fair Recent;   Poor Remote;   Good  Judgement:  Impaired  Insight:  Lacking  Psychomotor Activity:  Decreased  Concentration:  Fair  Recall:  [place]  Akathisia:  No  Handed:  Right  AIMS (if indicated):     Assets:  Housing Leisure Time Resilience Social Support  ADL's:  Impaired  Cognition: Impaired,  Moderate  Sleep:      Treatment Plan Summary: Daily contact with patient to assess and evaluate symptoms and progress in treatment, Medication management and Plan dementia with behavioral disturbances:  -Crisis stabilization -Medication management:  Restart home medications with a plan to taper off his Xanax.  Started Celexa 10 mg daily for depression, anxiety -Individual counseling  Disposition: Recommend psychiatric Inpatient admission when medically cleared.  LOWaylan BogaNP 10/24/2015 6:22 PM Patient seen face-to-face for psychiatric evaluation, chart reviewed and case discussed with the physician extender and developed treatment plan. Reviewed the information documented and agree with the treatment plan. MoCorena PilgrimMD

## 2015-10-24 NOTE — ED Notes (Addendum)
Pt was brought back from the main ER. He is disheleved and keeps telling his wife he is going to leave. Per wife pt has some extreme agitation and is not caring for himself. He denies any complaints at this time. PTs BP is elevated however he has not been given his am meds. Pt appears to be complete care with ADL's. He was transported via the steady to the bathroom and to the shower.pt needed maximum assistance with bathing .  Pt tolerated well.. Both legs with significant PVD. Pt does not appear to have any skin breakdown. Per the wife pt is often times incontinent of urine and stool. He is a two person assist to stand as has difficulty weight bearing and can not ambulate w./o assisitance. Phoned EDP to order a physical therapy consult. 1pm Pt was able to push himself up in the bed. Pt fed himself after he was set up. Pt appears comfortable at this time. 2p_Wife is very tearful stating,'I can not care for him anymore.I have two hernias from lifting him." Wife stated ,"he is really mad at me because he always wanted to stay at home." Physical therapy in with the pt. (2:10pm)4:30pm Pt OOB by himself and ambulating with a walker w/o assistance. 7pm Pt  Is very repetitive with his request. Pt kept asking how to change the channels to channel 2 and channel 45. He had to be reminded how to cut up his fish and to put it on a fork to eat. Bedside commode remains in the room. Report to [place]. (7pm )

## 2015-10-24 NOTE — ED Notes (Signed)
Informed NT tech pt needed help.

## 2015-10-25 ENCOUNTER — Telehealth: Payer: Self-pay | Admitting: *Deleted

## 2015-10-25 LAB — URINE CULTURE

## 2015-10-25 LAB — PROTIME-INR
INR: 3.08 — ABNORMAL HIGH (ref 0.00–1.49)
PROTHROMBIN TIME: 31.2 s — AB (ref 11.6–15.2)

## 2015-10-25 MED ORDER — WARFARIN - PHARMACIST DOSING INPATIENT: Freq: Every day | Status: DC

## 2015-10-25 MED ORDER — CITALOPRAM HYDROBROMIDE 20 MG PO TABS: 20.0000 mg | ORAL_TABLET | Freq: Every day | ORAL | Status: DC

## 2015-10-25 MED ORDER — HYDROXYZINE HCL 25 MG PO TABS
50.0000 mg | ORAL_TABLET | Freq: Every day | ORAL | Status: DC
Start: 2015-10-25 — End: 2015-10-25

## 2015-10-25 MED ORDER — CITALOPRAM HYDROBROMIDE 10 MG PO TABS: 10.0000 mg | ORAL_TABLET | Freq: Every day | ORAL | Status: DC

## 2015-10-25 NOTE — ED Provider Notes (Signed)
Case management recommends d/c with face to face order for home health. Pt and wife comfortable with the plan.   Derwood Kaplan, MD 10/25/15 1216

## 2015-10-25 NOTE — ED Notes (Addendum)
Pt is awake. He appears to be trying to do more for himself this am and is more motivated . Pt denies SI or HI and contracts for safety. Pt ate 100% of his breakfast. Wife is at the bedside. (10:20am ) Pt is for probable discharge today. Contacted social work to see the pts wife.Social work will see the pt and his wife this am. (10:50am) Pt took a shower with assistance. He has to be encouraged to do things for himself. 12noon- Wife remains at the bedside. Case worker in with the pt. And providing resources to the wife. Wife instructed not to give the pt his coumadin this pm -his level is over 3.0,

## 2015-10-25 NOTE — ED Notes (Signed)
Pt stated "I need a bucket to pee in."  Offered a hat; pt stated the edges were too sharp.  Offered a wash basin; pt accepted and utilized.

## 2015-10-25 NOTE — Progress Notes (Signed)
CSW received telephone call from nurse stating patient's wife wanted to speak with someone. Nurse stated patient's wife has stated she cannot care for patient once they arrive home. CSW informed Nurse CM of patient's wife wanting to speak with someone regarding needs once they arrive home. Nurse CM stated she would speak with the wife and patient.  Elenore Paddy [phone] ED CSW 10/25/2015  11:57 AM

## 2015-10-25 NOTE — Progress Notes (Signed)
ED Cm reviewed referral with [facility] referral declined ED Cm discussed with wife [place] referral declined Wife choice of [facility]  CM spoke with Clydie Braun of [facility] to provide referral  [facility] updated on call to [facility]  CM spoke with [facility] NP about BH rx Celexa 10 mg qd

## 2015-10-25 NOTE — ED Notes (Signed)
Nurse drawing labs. 

## 2015-10-25 NOTE — Progress Notes (Signed)
CM reviewed in details medicare guidelines, home health [facility]) (length of stay in home, types of [facility] staff available, coverage, primary caregiver, up to 24 hrs before services may be started) and Private duty nursing (PDN-coverage, length of stay in the home types of staff available). CM reviewed availability of HH SW to assist pcp to get pt to snf (if desired disposition) from the community level. CM provided pt/family with a list of [place] home health agencies and PDN.  Answered wife questions CM spoke with EDP [facility] RN Marylu Lund  Cm made contact with [facility] (wife choice of home health agency - She used their services previously) staff, Jorja Loa and Mayra Reel of [facility] for bariatric bedside commode

## 2015-10-25 NOTE — ED Notes (Signed)
Pt ambulated in room, using a walker, approximately 100 feet

## 2015-10-25 NOTE — Discharge Instructions (Signed)
Deconditioning Deconditioning refers to the changes in your body that occur during a period of inactivity. Deconditioning results in changes to your heart, lungs, and muscles. These changes decrease your ability to endure activity, resulting in feelings of fatigue and weakness. Deconditioning can occur after only a few days of bed rest or inactivity. The longer the period of inactivity, the more severe your symptoms of deconditioning will be. After longer periods of inactivity, it will also take longer for you to return to your previous level of functioning. Deconditioning can be mild, moderate, or severe:  Mild. Your condition interferes with your ability to perform your usual types of exercise, such as running, biking, or swimming.  Moderate. Your condition interferes with your ability to do normal everyday activities. This may include walking, grocery shopping, or doing chores or lawn work.  Severe. Your condition interferes with your ability to perform minimal activity or normal self-care. CAUSES  Some common reasons for inactivity that may result in deconditioning include:  Illnesses, such as cancer, stroke, heart attack, fibromyalgia, or chronic fatigue syndrome.  Injuries, especially back injuries, broken bones, or injury to ligaments or tendons.  Surgery or a long stay in the hospital for any reason.  Pregnancy, especially with conditions that require long periods of bed rest. RISK FACTORS Anything that results in a period of hospitalization or bed rest will put you at risk of deconditioning. Some other factors that can increase the risk include:  Obesity.  Poor nutrition.  Old age.  Injuries or illnesses that interfere with movement and activity. SIGNS AND SYMPTOMS  Feeling weak.  Feeling tired.  Shortness of breath with minor exertion.  Your heart beating faster than normal. You may or may not notice this without taking your pulse.  Pain or discomfort with  activity.  Decreased strength.  Decreased sense of balance.  Decreased endurance.  Difficulty participating in usual forms of exercise.  Difficulty doing activities of daily living, such as grocery shopping or chores.  Difficulty walking around the house and doing basic self-care, such as getting to the bathroom, preparing meals, or doing laundry. DIAGNOSIS  There is no specific test to diagnose deconditioning. Your health care provider will take your medical history and do a physical exam. During the physical exam, the health care provider will check for signs of deconditioning, such as:  Decreased size of muscles.  Decreased strength.  Difficulty with balance.  Shortness of breath or abnormally increased heart rate after minor exertion. TREATMENT  Treatment usually involves a structured exercise program in which activity is increased gradually. Your health care provider will determine which exercises are right for you. The exercise program will likely include aerobic exercise and strength training. Aerobic exercise helps improve the functioning of the heart and lungs as well as the muscles. Strength training helps improve muscle size and strength. Both of these types of exercise will improve your endurance. You may be referred to a physical therapist who can create a safe strengthening program for you to follow. HOME CARE INSTRUCTIONS  Follow the exercise program recommended by your health care provider or physical therapist.  Do not increase your exercise any faster than directed.  Eat a healthy diet.  If your health care provider thinks that you need to lose weight, consider seeing a dietitian to help you do so in a healthy way.  Do not use any tobacco products, including cigarettes, chewing tobacco, or electronic cigarettes. If you need help quitting, ask your health care provider.  Take  medicines only as directed by your health care provider.  Keep all follow-up visits as  directed by your health care provider. This is important. SEEK MEDICAL CARE IF:  You are not able to carry out the prescribed exercise program.  You are not able to carry out your usual level of activity.  You are having trouble doing normal household chores or caring for yourself.  You are becoming increasingly fatigued and weak.  You become light-headed when rising to a sitting or standing position.  Your level of endurance decreases after having improved. SEEK IMMEDIATE MEDICAL CARE IF:  You have chest pain.  You are very short of breath.  You have any episodes of passing out.   This information is not intended to replace advice given to you by your health care provider. Make sure you discuss any questions you have with your health care provider.   Document Released: 01/11/2014 Document Reviewed: 01/11/2014 Elsevier Interactive Patient Education 2016 Elsevier Inc.  Weakness Weakness is a lack of strength. It may be felt all over the body (generalized) or in one specific part of the body (focal). Some causes of weakness can be serious. You may need further medical evaluation, especially if you are elderly or you have a history of immunosuppression (such as chemotherapy or HIV), kidney disease, heart disease, or diabetes. CAUSES  Weakness can be caused by many different things, including:  Infection.  Physical exhaustion.  Internal bleeding or other blood loss that results in a lack of red blood cells (anemia).  Dehydration. This cause is more common in elderly people.  Side effects or electrolyte abnormalities from medicines, such as pain medicines or sedatives.  Emotional distress, anxiety, or depression.  Circulation problems, especially severe peripheral arterial disease.  Heart disease, such as rapid atrial fibrillation, bradycardia, or heart failure.  Nervous system disorders, such as Guillain-Barr syndrome, multiple sclerosis, or stroke. DIAGNOSIS  To find  the cause of your weakness, your caregiver will take your history and perform a physical exam. Lab tests or X-rays may also be ordered, if needed. TREATMENT  Treatment of weakness depends on the cause of your symptoms and can vary greatly. HOME CARE INSTRUCTIONS   Rest as needed.  Eat a well-balanced diet.  Try to get some exercise every day.  Only take over-the-counter or prescription medicines as directed by your caregiver. SEEK MEDICAL CARE IF:   Your weakness seems to be getting worse or spreads to other parts of your body.  You develop new aches or pains. SEEK IMMEDIATE MEDICAL CARE IF:   You cannot perform your normal daily activities, such as getting dressed and feeding yourself.  You cannot walk up and down stairs, or you feel exhausted when you do so.  You have shortness of breath or chest pain.  You have difficulty moving parts of your body.  You have weakness in only one area of the body or on only one side of the body.  You have a fever.  You have trouble speaking or swallowing.  You cannot control your bladder or bowel movements.  You have black or bloody vomit or stools. MAKE SURE YOU:  Understand these instructions.  Will watch your condition.  Will get help right away if you are not doing well or get worse.   This information is not intended to replace advice given to you by your health care provider. Make sure you discuss any questions you have with your health care provider.   Document Released: 08/27/2005 Document  Revised: 02/26/2012 Document Reviewed: 10/26/2011 Elsevier Interactive Patient Education [employer]

## 2015-10-25 NOTE — Progress Notes (Signed)
ANTICOAGULATION CONSULT NOTE - Initial Consult  Pharmacy Consult for Warfarin Indication: atrial fibrillation  No Known Allergies  Patient Measurements:   Total body weight:  Vital Signs: Temp: 98 F (36.7 C) (02/14 0651) Temp Source: Oral (02/14 0651) BP: 156/94 mmHg (02/14 0651) Pulse Rate: 99 (02/14 0651)  Labs:  Recent Labs  10/23/15 1837 10/24/15 0502 10/25/15 0435  HGB 14.4  --   --   HCT 42.7  --   --   PLT 207  --   --   LABPROT 29.5* 30.2* 31.2*  INR 2.86* 2.95* 3.08*  CREATININE 1.27*  --   --    CrCl cannot be calculated (Unknown ideal weight.).  Medical History: Past Medical History  Diagnosis Date  . Gout   . HTN (hypertension)   . Depressive disorder   . Atrial fibrillation (HCC)   . Osteoarthritis   . Obesity   . GERD (gastroesophageal reflux disease)   . Hypercholesteremia   . Carpal tunnel syndrome   . Legally blind in left eye, as defined in [place]   . Hypercholesteremia   . 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  They did surgery on that 15 yrs ago."   Medications:  Scheduled:  . allopurinol  300 mg Oral Daily  . citalopram  10 mg Oral Daily  . diltiazem  180 mg Oral BID  . lisinopril  40 mg Oral Daily  . meloxicam  15 mg Oral Daily  . metoprolol tartrate  25 mg Oral BID  . pantoprazole  40 mg Oral Daily  . pravastatin  40 mg Oral Daily  . warfarin  2.5 mg Oral Once per day on Sun Mon Wed Thu Sat  . warfarin  5 mg Oral Once per day on Tue Fri  . Warfarin - Pharmacist Dosing Inpatient   Does not apply q1800   Assessment: 41 yoM to ED by family; FTT, refusal to care for himself.  On chronic Warfarin for Afib, home dose  Tu,F and 2.5mg  other days, last dose 2/11. MD dosing from admit, INR increasing on usual home dose, Pharmacy will follow, dose Warfarin.  Today 10/25/2015 INR 3.08 Po intake unknown, regular diet ordered   Goal of Therapy:  INR 2-3 Monitor platelets by anticoagulation protocol:  Yes   Plan:   Hold Warfarin today  Daily PT/INR  Encourage po intake  Otho Bellows PharmD Pager [phone] 10/25/2015, 8:48 AM

## 2015-10-30 ENCOUNTER — Inpatient Hospital Stay (HOSPITAL_COMMUNITY)
Admission: EM | Admit: 2015-10-30 | Discharge: 2015-11-07 | DRG: 308 | Disposition: A | Discharge status: Skilled Nursing Facility | Payer: Medicare Other | Attending: Internal Medicine | Admitting: Internal Medicine

## 2015-10-30 ENCOUNTER — Emergency Department (HOSPITAL_COMMUNITY): Payer: Medicare Other

## 2015-10-30 ENCOUNTER — Encounter (HOSPITAL_COMMUNITY): Payer: Self-pay | Admitting: Emergency Medicine

## 2015-10-30 DIAGNOSIS — E876 Hypokalemia: Secondary | ICD-10-CM | POA: Diagnosis present

## 2015-10-30 DIAGNOSIS — K219 Gastro-esophageal reflux disease without esophagitis: Secondary | ICD-10-CM | POA: Diagnosis present

## 2015-10-30 DIAGNOSIS — B359 Dermatophytosis, unspecified: Secondary | ICD-10-CM | POA: Diagnosis present

## 2015-10-30 DIAGNOSIS — R32 Unspecified urinary incontinence: Secondary | ICD-10-CM | POA: Diagnosis present

## 2015-10-30 DIAGNOSIS — Z6841 Body Mass Index (BMI) 40.0 and over, adult: Secondary | ICD-10-CM

## 2015-10-30 DIAGNOSIS — I482 Chronic atrial fibrillation: Principal | ICD-10-CM | POA: Diagnosis present

## 2015-10-30 DIAGNOSIS — I4891 Unspecified atrial fibrillation: Secondary | ICD-10-CM | POA: Diagnosis not present

## 2015-10-30 DIAGNOSIS — E872 Acidosis: Secondary | ICD-10-CM | POA: Diagnosis present

## 2015-10-30 DIAGNOSIS — R440 Auditory hallucinations: Secondary | ICD-10-CM | POA: Diagnosis present

## 2015-10-30 DIAGNOSIS — F0391 Unspecified dementia with behavioral disturbance: Secondary | ICD-10-CM | POA: Diagnosis present

## 2015-10-30 DIAGNOSIS — F03918 Unspecified dementia, unspecified severity, with other behavioral disturbance: Secondary | ICD-10-CM | POA: Diagnosis present

## 2015-10-30 DIAGNOSIS — F13239 Sedative, hypnotic or anxiolytic dependence with withdrawal, unspecified: Secondary | ICD-10-CM | POA: Diagnosis present

## 2015-10-30 DIAGNOSIS — G9081 Serotonin syndrome: Secondary | ICD-10-CM | POA: Diagnosis present

## 2015-10-30 DIAGNOSIS — F13232 Sedative, hypnotic or anxiolytic dependence with withdrawal with perceptual disturbance: Secondary | ICD-10-CM | POA: Diagnosis present

## 2015-10-30 DIAGNOSIS — N5089 Other specified disorders of the male genital organs: Secondary | ICD-10-CM | POA: Diagnosis present

## 2015-10-30 DIAGNOSIS — F13932 Sedative, hypnotic or anxiolytic use, unspecified with withdrawal with perceptual disturbances: Secondary | ICD-10-CM | POA: Diagnosis present

## 2015-10-30 DIAGNOSIS — G2579 Other drug induced movement disorders: Secondary | ICD-10-CM | POA: Diagnosis present

## 2015-10-30 DIAGNOSIS — R5381 Other malaise: Secondary | ICD-10-CM | POA: Diagnosis present

## 2015-10-30 DIAGNOSIS — G9340 Encephalopathy, unspecified: Secondary | ICD-10-CM | POA: Diagnosis present

## 2015-10-30 DIAGNOSIS — F329 Major depressive disorder, single episode, unspecified: Secondary | ICD-10-CM | POA: Diagnosis present

## 2015-10-30 DIAGNOSIS — E860 Dehydration: Secondary | ICD-10-CM | POA: Diagnosis present

## 2015-10-30 DIAGNOSIS — F419 Anxiety disorder, unspecified: Secondary | ICD-10-CM

## 2015-10-30 DIAGNOSIS — I10 Essential (primary) hypertension: Secondary | ICD-10-CM | POA: Diagnosis present

## 2015-10-30 DIAGNOSIS — F0280 Dementia in other diseases classified elsewhere without behavioral disturbance: Secondary | ICD-10-CM | POA: Diagnosis present

## 2015-10-30 DIAGNOSIS — E785 Hyperlipidemia, unspecified: Secondary | ICD-10-CM | POA: Diagnosis present

## 2015-10-30 DIAGNOSIS — Z7901 Long term (current) use of anticoagulants: Secondary | ICD-10-CM

## 2015-10-30 DIAGNOSIS — Q5564 Hidden penis: Secondary | ICD-10-CM | POA: Diagnosis not present

## 2015-10-30 DIAGNOSIS — R0602 Shortness of breath: Secondary | ICD-10-CM

## 2015-10-30 DIAGNOSIS — H5442 Blindness, left eye, normal vision right eye: Secondary | ICD-10-CM | POA: Diagnosis present

## 2015-10-30 DIAGNOSIS — F0393 Unspecified dementia, unspecified severity, with mood disturbance: Secondary | ICD-10-CM | POA: Diagnosis present

## 2015-10-30 LAB — I-STAT CHEM 8, ED
BUN: 12 mg/dL (ref 6–20)
CALCIUM ION: 0.97 mmol/L — AB (ref 1.13–1.30)
CREATININE: 0.7 mg/dL (ref 0.61–1.24)
Chloride: 95 mmol/L — ABNORMAL LOW (ref 101–111)
Glucose, Bld: 118 mg/dL — ABNORMAL HIGH (ref 65–99)
HCT: 49 % (ref 39.0–52.0)
Hemoglobin: 16.7 g/dL (ref 13.0–17.0)
Potassium: 3.3 mmol/L — ABNORMAL LOW (ref 3.5–5.1)
Sodium: 138 mmol/L (ref 135–145)
TCO2: 31 mmol/L (ref 0–100)

## 2015-10-30 LAB — COMPREHENSIVE METABOLIC PANEL
ALT: 11 U/L — ABNORMAL LOW (ref 17–63)
ANION GAP: 13 (ref 5–15)
AST: 26 U/L (ref 15–41)
Albumin: 4.5 g/dL (ref 3.5–5.0)
Alkaline Phosphatase: 93 U/L (ref 38–126)
BUN: 16 mg/dL (ref 6–20)
CHLORIDE: 96 mmol/L — AB (ref 101–111)
CO2: 26 mmol/L (ref 22–32)
Calcium: 9 mg/dL (ref 8.9–10.3)
Creatinine, Ser: 0.97 mg/dL (ref 0.61–1.24)
GFR calc non Af Amer: >60 mL/min (ref >60)
Glucose, Bld: 130 mg/dL — ABNORMAL HIGH (ref 65–99)
Potassium: 2.9 mmol/L — ABNORMAL LOW (ref 3.5–5.1)
SODIUM: 135 mmol/L (ref 135–145)
Total Bilirubin: 0.8 mg/dL (ref 0.3–1.2)
Total Protein: 7.5 g/dL (ref 6.5–8.1)

## 2015-10-30 LAB — CBC WITH DIFFERENTIAL/PLATELET
BASOS PCT: 0 %
Basophils Absolute: 0 10*3/uL (ref 0.0–0.1)
Eosinophils Absolute: 0 10*3/uL (ref 0.0–0.7)
Eosinophils Relative: 0 %
HEMATOCRIT: 45.7 % (ref 39.0–52.0)
HEMOGLOBIN: 15.6 g/dL (ref 13.0–17.0)
LYMPHS ABS: 1 10*3/uL (ref 0.7–4.0)
Lymphocytes Relative: 11 %
MCH: 33.3 pg (ref 26.0–34.0)
MCHC: 34.1 g/dL (ref 30.0–36.0)
MCV: 97.4 fL (ref 78.0–100.0)
MONOS PCT: 11 %
Monocytes Absolute: 1 10*3/uL (ref 0.1–1.0)
NEUTROS ABS: 6.8 10*3/uL (ref 1.7–7.7)
NEUTROS PCT: 78 %
Platelets: 253 10*3/uL (ref 150–400)
RBC: 4.69 MIL/uL (ref 4.22–5.81)
RDW: 15 % (ref 11.5–15.5)
WBC: 8.8 10*3/uL (ref 4.0–10.5)

## 2015-10-30 LAB — URINALYSIS, ROUTINE W REFLEX MICROSCOPIC
BILIRUBIN URINE: NEGATIVE
Glucose, UA: 100 mg/dL — AB
Ketones, ur: NEGATIVE mg/dL
Leukocytes, UA: NEGATIVE
NITRITE: NEGATIVE
PH: 6.5 (ref 5.0–8.0)
Protein, ur: NEGATIVE mg/dL
SPECIFIC GRAVITY, URINE: 1.011 (ref 1.005–1.030)

## 2015-10-30 LAB — LACTIC ACID, PLASMA
LACTIC ACID, VENOUS: 2.2 mmol/L — AB (ref 0.5–2.0)
LACTIC ACID, VENOUS: 1.1 mmol/L (ref 0.5–2.0)

## 2015-10-30 LAB — BASIC METABOLIC PANEL
Anion gap: 13 (ref 5–15)
BUN: 11 mg/dL (ref 6–20)
CO2: 25 mmol/L (ref 22–32)
CREATININE: 0.83 mg/dL (ref 0.61–1.24)
Calcium: 8.6 mg/dL — ABNORMAL LOW (ref 8.9–10.3)
Chloride: 100 mmol/L — ABNORMAL LOW (ref 101–111)
GFR calc Af Amer: >60 mL/min (ref >60)
Glucose, Bld: 148 mg/dL — ABNORMAL HIGH (ref 65–99)
POTASSIUM: 3.6 mmol/L (ref 3.5–5.1)
SODIUM: 138 mmol/L (ref 135–145)

## 2015-10-30 LAB — PROTIME-INR
INR: 1.8 — AB (ref 0.00–1.49)
PROTHROMBIN TIME: 20.9 s — AB (ref 11.6–15.2)

## 2015-10-30 LAB — I-STAT TROPONIN, ED: Troponin i, poc: 0.03 ng/mL (ref 0.00–0.08)

## 2015-10-30 LAB — URINE MICROSCOPIC-ADD ON

## 2015-10-30 LAB — MAGNESIUM: Magnesium: 1.3 mg/dL — ABNORMAL LOW (ref 1.7–2.4)

## 2015-10-30 LAB — TROPONIN I: TROPONIN I: 0.06 ng/mL — AB (ref <0.031)

## 2015-10-30 LAB — PHOSPHORUS: Phosphorus: 2 mg/dL — ABNORMAL LOW (ref 2.5–4.6)

## 2015-10-30 LAB — MRSA PCR SCREENING: MRSA BY PCR: NEGATIVE

## 2015-10-30 MED ORDER — POTASSIUM CHLORIDE 10 MEQ/100ML IV SOLN
10.0000 meq | Freq: Once | INTRAVENOUS | Status: AC
  Administered 2015-10-30: 10 meq via INTRAVENOUS
  Filled 2015-10-30: qty 100

## 2015-10-30 MED ORDER — MAGNESIUM SULFATE 2 GM/50ML IV SOLN
2.0000 g | Freq: Once | INTRAVENOUS | Status: DC
  Filled 2015-10-30: qty 50

## 2015-10-30 MED ORDER — METOPROLOL TARTRATE 1 MG/ML IV SOLN
5.0000 mg | Freq: Once | INTRAVENOUS | Status: AC
  Administered 2015-10-30: 5 mg via INTRAVENOUS
  Filled 2015-10-30: qty 5

## 2015-10-30 MED ORDER — SODIUM CHLORIDE 0.9 % IV BOLUS (SEPSIS)
1000.0000 mL | Freq: Once | INTRAVENOUS | Status: AC
  Administered 2015-10-30: 1000 mL via INTRAVENOUS

## 2015-10-30 MED ORDER — DILTIAZEM HCL 100 MG IV SOLR
INTRAVENOUS | Status: AC
  Administered 2015-10-30: 18:00:00
  Filled 2015-10-30: qty 100

## 2015-10-30 MED ORDER — NYSTATIN 100000 UNIT/GM EX POWD
Freq: Three times a day (TID) | CUTANEOUS | Status: DC
  Administered 2015-10-30 – 2015-11-05 (×17): via TOPICAL
  Administered 2015-11-05: 1 via TOPICAL
  Administered 2015-11-05 – 2015-11-07 (×5): via TOPICAL
  Filled 2015-10-30: qty 15

## 2015-10-30 MED ORDER — LORAZEPAM 2 MG/ML IJ SOLN
1.0000 mg | Freq: Once | INTRAMUSCULAR | Status: AC
  Administered 2015-10-30: 1 mg via INTRAVENOUS
  Filled 2015-10-30: qty 1

## 2015-10-30 MED ORDER — WARFARIN - PHARMACIST DOSING INPATIENT: Freq: Every day | Status: DC

## 2015-10-30 MED ORDER — METOPROLOL TARTRATE 25 MG PO TABS
25.0000 mg | ORAL_TABLET | Freq: Two times a day (BID) | ORAL | Status: DC
  Administered 2015-10-30 – 2015-10-31 (×2): 25 mg via ORAL
  Filled 2015-10-30 (×2): qty 1

## 2015-10-30 MED ORDER — K PHOS MONO-SOD PHOS DI & MONO 155-852-130 MG PO TABS
500.0000 mg | ORAL_TABLET | Freq: Three times a day (TID) | ORAL | Status: DC
  Administered 2015-10-30 – 2015-10-31 (×2): 500 mg via ORAL
  Filled 2015-10-30 (×4): qty 2

## 2015-10-30 MED ORDER — METOPROLOL TARTRATE 1 MG/ML IV SOLN: 5.0000 mg | Freq: Once | INTRAVENOUS | Status: DC

## 2015-10-30 MED ORDER — DILTIAZEM LOAD VIA INFUSION
10.0000 mg | Freq: Once | INTRAVENOUS | Status: AC
  Administered 2015-10-30: 10 mg via INTRAVENOUS
  Filled 2015-10-30: qty 10

## 2015-10-30 MED ORDER — IOHEXOL 300 MG/ML  SOLN
100.0000 mL | Freq: Once | INTRAMUSCULAR | Status: AC | PRN
  Administered 2015-10-30: 100 mL via INTRAVENOUS

## 2015-10-30 MED ORDER — LORAZEPAM 1 MG PO TABS
1.0000 mg | ORAL_TABLET | Freq: Once | ORAL | Status: AC
  Administered 2015-10-30: 1 mg via ORAL
  Filled 2015-10-30: qty 1

## 2015-10-30 MED ORDER — LORAZEPAM 2 MG/ML IJ SOLN: 1.0000 mg | Freq: Once | INTRAMUSCULAR | Status: DC

## 2015-10-30 MED ORDER — WARFARIN SODIUM 5 MG PO TABS
5.0000 mg | ORAL_TABLET | Freq: Once | ORAL | Status: AC
  Administered 2015-10-30: 5 mg via ORAL
  Filled 2015-10-30: qty 1

## 2015-10-30 MED ORDER — LORAZEPAM 2 MG/ML IJ SOLN
1.0000 mg | INTRAMUSCULAR | Status: DC | PRN
  Administered 2015-10-31 (×3): 1 mg via INTRAVENOUS
  Filled 2015-10-30 (×3): qty 1

## 2015-10-30 MED ORDER — POTASSIUM CHLORIDE CRYS ER 20 MEQ PO TBCR
60.0000 meq | EXTENDED_RELEASE_TABLET | Freq: Once | ORAL | Status: AC
  Administered 2015-10-30: 60 meq via ORAL
  Filled 2015-10-30: qty 3

## 2015-10-30 MED ORDER — PRAVASTATIN SODIUM 40 MG PO TABS
40.0000 mg | ORAL_TABLET | Freq: Every day | ORAL | Status: DC
  Administered 2015-10-30 – 2015-11-06 (×8): 40 mg via ORAL
  Filled 2015-10-30 (×4): qty 1
  Filled 2015-10-30: qty 2
  Filled 2015-10-30 (×3): qty 1
  Filled 2015-10-30: qty 2
  Filled 2015-10-30: qty 1
  Filled 2015-10-30: qty 2

## 2015-10-30 MED ORDER — DILTIAZEM HCL 100 MG IV SOLR
5.0000 mg/h | INTRAVENOUS | Status: DC
  Administered 2015-10-30: 15 mg/h via INTRAVENOUS
  Administered 2015-10-30: 5 mg/h via INTRAVENOUS
  Administered 2015-10-31 (×3): 15 mg/h via INTRAVENOUS
  Administered 2015-11-01: 10 mg/h via INTRAVENOUS
  Filled 2015-10-30 (×6): qty 100

## 2015-10-30 MED ORDER — PROMETHAZINE HCL 25 MG/ML IJ SOLN
12.5000 mg | Freq: Once | INTRAMUSCULAR | Status: AC
  Administered 2015-10-30: 12.5 mg via INTRAVENOUS
  Filled 2015-10-30: qty 1

## 2015-10-30 MED ORDER — POTASSIUM CHLORIDE CRYS ER 20 MEQ PO TBCR: 40.0000 meq | EXTENDED_RELEASE_TABLET | Freq: Once | ORAL | Status: DC

## 2015-10-30 MED ORDER — ONDANSETRON HCL 4 MG/2ML IJ SOLN
4.0000 mg | Freq: Four times a day (QID) | INTRAMUSCULAR | Status: DC | PRN
  Administered 2015-11-03 (×2): 4 mg via INTRAVENOUS
  Filled 2015-10-30 (×2): qty 2

## 2015-10-30 MED ORDER — TORSEMIDE 20 MG PO TABS
20.0000 mg | ORAL_TABLET | Freq: Every day | ORAL | Status: DC | PRN
  Filled 2015-10-30: qty 1

## 2015-10-30 MED ORDER — CITALOPRAM HYDROBROMIDE 10 MG PO TABS
10.0000 mg | ORAL_TABLET | Freq: Every day | ORAL | Status: DC
  Administered 2015-10-30 – 2015-10-31 (×2): 10 mg via ORAL
  Filled 2015-10-30 (×2): qty 1

## 2015-10-30 MED ORDER — IOHEXOL 300 MG/ML  SOLN
50.0000 mL | Freq: Once | INTRAMUSCULAR | Status: AC | PRN
  Administered 2015-10-30: 50 mL via ORAL

## 2015-10-30 MED ORDER — PROMETHAZINE HCL 25 MG/ML IJ SOLN: 12.5000 mg | Freq: Four times a day (QID) | INTRAMUSCULAR | Status: DC | PRN

## 2015-10-30 MED ORDER — PANTOPRAZOLE SODIUM 40 MG PO TBEC
40.0000 mg | DELAYED_RELEASE_TABLET | Freq: Every day | ORAL | Status: DC
  Administered 2015-10-30 – 2015-11-07 (×9): 40 mg via ORAL
  Filled 2015-10-30 (×9): qty 1

## 2015-10-30 MED ORDER — ALLOPURINOL 300 MG PO TABS
300.0000 mg | ORAL_TABLET | Freq: Every day | ORAL | Status: DC
  Administered 2015-10-30 – 2015-11-07 (×9): 300 mg via ORAL
  Filled 2015-10-30: qty 3
  Filled 2015-10-30: qty 1
  Filled 2015-10-30: qty 3
  Filled 2015-10-30 (×2): qty 1
  Filled 2015-10-30: qty 3
  Filled 2015-10-30 (×6): qty 1
  Filled 2015-10-30: qty 3

## 2015-10-30 MED ORDER — MAGNESIUM SULFATE 2 GM/50ML IV SOLN
2.0000 g | Freq: Once | INTRAVENOUS | Status: AC
  Administered 2015-10-30: 2 g via INTRAVENOUS
  Filled 2015-10-30: qty 50

## 2015-10-30 MED ORDER — ACETAMINOPHEN 325 MG PO TABS
650.0000 mg | ORAL_TABLET | Freq: Three times a day (TID) | ORAL | Status: DC
  Administered 2015-10-30 – 2015-11-07 (×23): 650 mg via ORAL
  Filled 2015-10-30 (×26): qty 2

## 2015-10-30 MED ORDER — ONDANSETRON HCL 4 MG/2ML IJ SOLN
4.0000 mg | Freq: Once | INTRAMUSCULAR | Status: AC
  Administered 2015-10-30: 4 mg via INTRAVENOUS
  Filled 2015-10-30: qty 2

## 2015-10-30 NOTE — ED Notes (Signed)
Brought in by EMS from home with c/o scrotal swelling.  Per EMS, pt reports that he has been having frequent urination but has "hidden penis".  Pt reports that d/t to his hidden penis, he feels that his "urine is going into his scrotum causing it to swell".  Pt denies dysuria or scrotal pain.

## 2015-10-30 NOTE — Clinical Social Work Note (Signed)
Clinical Social Work Assessment  Patient Details  Name: Ryan Barker MRN: [id number] Date of Birth: 02/13/1946  Date of referral:  10/30/15               Reason for consult:  Other (Comment Required) (nothing stated as the consult)                Permission sought to share information with:    Permission granted to share information::  Yes, Verbal Permission Granted (spouse)  Name::        Agency::     Relationship::     Contact Information:     Housing/Transportation Living arrangements for the past 2 months:  Single Family Home Source of Information:  Patient, Spouse Patient Interpreter Needed:  None Criminal Activity/Legal Involvement Pertinent to Current Situation/Hospitalization:    Significant Relationships:  Adult Children, Spouse Lives with:  Spouse Do you feel safe going back to the place where you live?  No Need for family participation in patient care:  Yes (Comment)  Care giving concerns:  Caregiver is concerned that pt has begun to have problems with his speech.  She is also concerned that pt has started to hear things in the home stating that he is always saying that he hears doors opening and closing and she is asleep when this occurs.     Social Worker assessment / plan:  CSW met with pt and his wife at bedside to discuss needs.  CSW prompted pt and family to discuss history and needs.  A review of pt's chart revealed that he was at [facility] a week ago and was released with home health services.At that time he had reported a 20 year history of xanax and was taken off of it when hospitalized last week.  Pt was started on Celexa last week when his xanax was discontinued.    Notes reflect pt was concerned with his insurance paying for a SNF so he declined SNF at last hospital stay.  CSW provided information about services that pt may qualify for if needed at discharge.  CSW also provided information regarding pt seeking a psychiatrist in the community as he currently does not  have one and has been receiving his mental health medications from his primary care doctor.  CSW will continue to meet with pt and family until discharge to assess needs and services.  Employment status:  Disabled (Comment on whether or not currently receiving Disability) (knees) Insurance information:  Managed Care PT Recommendations:  Not assessed at this time Information / Referral to community resources:     Patient/Family's Response to care:  Pt discussed his recent hospital stay and the medications that he takes for his anxiety.  Pt discussed concern over his enlarged private/groin area and attempted to show it to CSW. Pt also endorsed hearing the nurses speak to him through the speakers. Pt requesting to go to [facility] for inpatient treatment. Pt's wife reported that pt declined [facility] last week when he was at hospital but is now stating that he wants to go there.  Pt's wife discussed pt's urinating often and also peed on himself this morning.  Pt's wife discussed recent dementia like behaviors such as not being able to finish sentences or not making sense when he speaks.  Pt's wife also discussed recent weight loss stating pt had lost 30 pounds over last month and then 6 pounds over past two days.  Pt's wife reported that pt believes he is hearing her open  and close doors at home when she is asleep and also at hospital he "swears he hears the nurses talking through the walls".  Pt's wife reported that pt has not been sleeping since starting his celexa, is "exhausted" and has been sweating a lot .   Pt's wife reported that home health services began last week with the assessments being completed but services have not begun.  Pt will be receive home health PT, nursing and social work services as well as speech therapy.    Patient/Family's Understanding of and Emotional Response to Diagnosis, Current Treatment, and Prognosis:  Pt and wife are confused with what is happening with pt.  Tests are being run and  the MD's are admitting pt for further review.  Pt's wife concerned with dementia like symptoms and physical health problems.  Pt does not understand what is happening with his physical and mental health and are anxious to get results with this hospital stay.  Pt's wife appreciative of CSW support and stated "thank you for listening".   Emotional Assessment Appearance:  Appears stated age Attitude/Demeanor/Rapport:   (anxious) Affect (typically observed):  Afraid/Fearful Orientation:  Oriented to Self, Oriented to Place, Oriented to Situation, Oriented to  Time Alcohol / Substance use:    Psych involvement (Current and /or in the community):     Discharge Needs  Concerns to be addressed:    Readmission within the last 30 days:  Yes Current discharge risk:  Cognitively Impaired, Dependent with Mobility Barriers to Discharge:      Caswell Corwin 10/30/2015, 1:36 PM

## 2015-10-30 NOTE — ED Provider Notes (Signed)
CSN: [id number]     Arrival date & time 10/30/15  0435 History   First MD Initiated Contact with Patient 10/30/15 0500     Chief Complaint  Patient presents with  . Groin Swelling     (Consider location/radiation/quality/duration/timing/severity/associated sxs/prior Treatment) HPI Comments: Patient with a history of A-fib on coumadin, HTN, anxiety, presents from home with wife complaining of increased urinary frequency without dysuria or hematuria. He has intermittent nausea without vomiting and reports "I just don't feel well." Per wife, he has had recent weight loss of 6 pounds in the last week, is not eating or sleeping well. He has a history of anxiety and called EMS around 8:00 pm last night for a panic attack. EMS personnel was able to calm him down and transportation to the hospital was not necessary. Symptoms of frequency, malaise and nausea started around 10:00 pm and have been persistent through the night, prompting evaluation in the emergency room. The patient and wife report that he was seen and treated for his "mental health" this past week and started on some new medications that they are concerned might be lending to current symptoms. No known fever. The patient denies pain, SOB, CP, URI symptoms or diarrhea.   The history is provided by the patient and the spouse. No language interpreter was used.    Past Medical History  Diagnosis Date  . Gout   . HTN (hypertension)   . Depressive disorder   . Atrial fibrillation (HCC)   . Osteoarthritis   . Obesity   . GERD (gastroesophageal reflux disease)   . Hypercholesteremia   . Carpal tunnel syndrome   . Legally blind in left eye, as defined in [place]   . Hypercholesteremia   . 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  They did surgery on that 15 yrs ago."   Past Surgical History  Procedure Laterality Date  . Tonsillectomy    . Inguinal hernia repair     History reviewed. No pertinent family  history. Social History  Substance Use Topics  . Smoking status: Never Smoker   . Smokeless tobacco: None  . Alcohol Use: No    Review of Systems  Constitutional: Positive for appetite change and fatigue. Negative for fever and chills.  HENT: Negative.   Respiratory: Negative.   Cardiovascular: Negative.   Gastrointestinal: Positive for nausea. Negative for vomiting, abdominal pain, diarrhea and blood in stool.  Genitourinary: Positive for frequency and scrotal swelling. Negative for dysuria, hematuria and testicular pain.  Musculoskeletal: Negative.  Negative for myalgias.  Skin: Negative.  Negative for rash.  Neurological: Negative.   Psychiatric/Behavioral: Positive for sleep disturbance.      Allergies  Review of patient's allergies indicates no known allergies.  Home Medications   Prior to Admission medications   Medication Sig Start Date End Date Taking? Authorizing Provider  acetaminophen (TYLENOL) 500 MG tablet Take 500-2,000 mg by mouth 2 (two) times daily as needed for moderate pain.    Historical Provider, MD  allopurinol (ZYLOPRIM) 300 MG tablet Take 300 mg by mouth daily.    Historical Provider, MD  ALPRAZolam Prudy Feeler) 1 MG tablet Take 1 mg by mouth 3 (three) times daily as needed for anxiety or sleep.     Historical Provider, MD  citalopram (CELEXA) 10 MG tablet Take 1 tablet (10 mg total) by mouth daily. 10/26/15   Derwood Kaplan, MD  diltiazem (CARDIZEM) 60 MG tablet Take 180 mg by  mouth 2 (two) times daily.  04/06/13   Historical Provider, MD  lisinopril (PRINIVIL,ZESTRIL) 40 MG tablet Take 40 mg by mouth daily.    Historical Provider, MD  meloxicam (MOBIC) 15 MG tablet Take 15 mg by mouth daily.    Historical Provider, MD  metoprolol tartrate (LOPRESSOR) 25 MG tablet Take 25 mg by mouth 2 (two) times daily.    Historical Provider, MD  omeprazole (PRILOSEC) 40 MG capsule Take 40 mg by mouth daily.    Historical Provider, MD  pravastatin (PRAVACHOL) 40 MG tablet  Take 40 mg by mouth daily.    Historical Provider, MD  torsemide (DEMADEX) 20 MG tablet Take 20 mg by mouth daily as needed (for swelling).     Historical Provider, MD  warfarin (COUMADIN) 5 MG tablet Take 2.5-5 mg by mouth daily. Take  qd on Tuesdays and Fridays, and take 2.5mg  qd on Monday, Wednesday, Thursday , Saturday and Sunday    Historical Provider, MD   BP 181/83 mmHg  Pulse 113  Temp(Src) 97.5 F (36.4 C) (Oral)  Resp 20  SpO2 99% Physical Exam  Constitutional: He is oriented to person, place, and time. He appears well-developed and well-nourished.  HENT:  Head: Normocephalic.  Neck: Normal range of motion. Neck supple.  Cardiovascular: Normal rate and regular rhythm.   Pulmonary/Chest: Effort normal and breath sounds normal. He exhibits no tenderness.  Abdominal: Soft. Bowel sounds are normal. There is no tenderness. There is no rebound and no guarding.  Genitourinary:  No scrotal swelling or mass. Micropenis. There is a candidal rash in the folds of the groin.   Musculoskeletal: Normal range of motion.  Neurological: He is alert and oriented to person, place, and time.  Skin: Skin is warm and dry. No rash noted.  Psychiatric: He has a normal mood and affect.    ED Course  Procedures (including critical care time) Labs Review Labs Reviewed  URINE CULTURE  COMPREHENSIVE METABOLIC PANEL  CBC WITH DIFFERENTIAL/PLATELET  URINALYSIS, ROUTINE W REFLEX MICROSCOPIC (NOT AT [facility])  LACTIC ACID, PLASMA  LACTIC ACID, PLASMA  PROTIME-INR    Imaging Review No results found. I have personally reviewed and evaluated these images and lab results as part of my medical decision-making.   EKG Interpretation None      MDM   Final diagnoses:  None    1. Malaise  The patient will need to be evaluated with labs, including UA. He is tachycardic, hypothermic raising concern for possible sepsis but patient is well appearing, non-toxic, oriented. Patient care signed out to  [facility], PA-C, pending labs and re-evaluation.     Elpidio Anis, PA-C 10/30/15 1610  April Palumbo, MD 10/30/15 9604  April Palumbo, MD 10/30/15 [phone]

## 2015-10-30 NOTE — ED Notes (Signed)
Bed: WA09 Expected date:  Expected time:  Means of arrival:  Comments: EMS 70yo M urinary retention / anxiety; seen earlier for same

## 2015-10-30 NOTE — ED Notes (Signed)
I have notified Dr. Robb Matar as to the recalcitrance of his heart rate; the Cardizem drip notwithstanding.  Pt. Is relaxed in appearance and in no distress.

## 2015-10-30 NOTE — ED Notes (Signed)
He remains in no distress.  His wife remains with him and is of great comfort to him.

## 2015-10-30 NOTE — BH Assessment (Signed)
Assessment completed. Consulted with Dahlia Byes, PMHNP who states that patient does not meet inpatient criteria at this time due to patient denying SI/HI and AVH. EDP informed of disposition.   Davina Poke, LCSW Therapeutic Triage Specialist [facility] 10/30/2015 11:32 AM

## 2015-10-30 NOTE — Progress Notes (Signed)
ANTICOAGULATION CONSULT NOTE - Initial Consult  Pharmacy Consult for Warfarin Indication: atrial fibrillation  No Known Allergies  Patient Measurements:    Vital Signs: Temp: 98.3 F (36.8 C) (02/19 1532) Temp Source: Oral (02/19 1532) BP: 132/84 mmHg (02/19 1705) Pulse Rate: 118 (02/19 1747)  Labs:  Recent Labs  10/30/15 0539 10/30/15 1109  HGB 15.6 16.7  HCT 45.7 49.0  PLT 253  --   LABPROT 20.9*  --   INR 1.80*  --   CREATININE 0.97 0.70    CrCl cannot be calculated (Unknown ideal weight.).   Medical History: Past Medical History  Diagnosis Date  . Gout   . HTN (hypertension)   . Depressive disorder   . Atrial fibrillation (HCC)   . Osteoarthritis   . Obesity   . GERD (gastroesophageal reflux disease)   . Hypercholesteremia   . Carpal tunnel syndrome   . Legally blind in left eye, as defined in [place]   . Hypercholesteremia   . 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  They did surgery on that 15 yrs ago."    Medications:  Scheduled:  . acetaminophen  650 mg Oral 3 times per day  . allopurinol  300 mg Oral Daily  . citalopram  10 mg Oral Daily  . LORazepam  1 mg Intravenous Once  . metoprolol  5 mg Intravenous Once  . metoprolol tartrate  25 mg Oral BID  . nystatin   Topical TID  . pantoprazole  40 mg Oral Daily  . pravastatin  40 mg Oral Daily   Infusions:  . diltiazem (CARDIZEM) infusion 15 mg/hr (10/30/15 1418)  . magnesium sulfate 1 - 4 g bolus IVPB      Assessment: 46 yoM presented to [facility] ED on 2/19 with scrotal swelling, anxiety.  His PMH includes atrial fibrillation on chronic warfarin anticoagulation.  He is noted to have Afib with RVR.  Pharmacy is consulted to continue warfarin dosing inpatient.  Home warfarin dose is reported as 2.5 mg daily except  on Tuesday/Fridays.  Last dose was 2/18.  Admission INR is 1.8.  Today, 10/30/2015: INR 1.8 CBC: Hgb and Plt WNL Diet: heart healthy No major  drug-drug interactions noted   Goal of Therapy:  INR 2-3 Monitor platelets by anticoagulation protocol: Yes   Plan:  Warfarin  PO tonight at 2000. Daily INR and CBC  Lynann Beaver PharmD, BCPS Pager [phone] 10/30/2015 5:56 PM

## 2015-10-30 NOTE — ED Notes (Signed)
He has a non-protuberant penis and we have ticked towels to catch his (incontinent) urine; which we change again now.

## 2015-10-30 NOTE — ED Notes (Signed)
He continues to be anxious and is speaking in paranoid phrases.  He mentions "people talking about my pharmacy list on the other side of that wall" ?hallucinations? He remains in no distress and his skin is normal, warm and dry and he is breathing normally.

## 2015-10-30 NOTE — ED Provider Notes (Signed)
CSN: [id number]     Arrival date & time 10/30/15  0435 History   First MD Initiated Contact with Patient 10/30/15 0500     Chief Complaint  Patient presents with  . Groin Swelling   HPI   Care assumed from [facility], [place].  Ryan Barker is an 70 y.o. male with history of afib (on Coumadin), depression, anxiety, HTN, HLD, micropenis who presents to the ED for evaluation of increased urinary frequency, groin swelling, nausea, and malaise. Pt was discharged last week from [facility] where he was taken off of Xanax (which he had previously been on for 20 years) and started on Celexa. He states that since then he has generally felt unwell. He reports intermittent nausea and chills, but denies emesis. He has apparently reported intermittent emesis to other staff members, but denies to me multiple times. His wife accompanies him and reports that pt has not been eating as much as usual and thinks he has lost six pounds in the past week. Pt states last night he since around 11:30 PM he started noticing he was urinating much more frequently than normal. He states that twice he needed to urinate so bad that he had two episodes of urinary incontinence. He states he feels like his scrotum is swollen. Pt also reports a lot of continued anxiety since being discharged from [facility]. States he feels like the Celexa is not working. Denies SI/HI, AH/VH.  Per previous tx team, plan is to check UA, labs, lactic. No scrotal tenderness or edema, no need for scrotal US at this time. Pt apparently does have what appears to be candidal skin rash on GU area for which he is using unknown topical treatment.  Past Medical History  Diagnosis Date  . Gout   . HTN (hypertension)   . Depressive disorder   . Atrial fibrillation (HCC)   . Osteoarthritis   . Obesity   . GERD (gastroesophageal reflux disease)   . Hypercholesteremia   . Carpal tunnel syndrome   . Legally blind in left eye, as defined in [place]   . Hypercholesteremia   .  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  They did surgery on that 15 yrs ago."   Past Surgical History  Procedure Laterality Date  . Tonsillectomy    . Inguinal hernia repair     History reviewed. No pertinent family history. Social History  Substance Use Topics  . Smoking status: Never Smoker   . Smokeless tobacco: None  . Alcohol Use: No    Review of Systems  All other systems reviewed and are negative.     Allergies  Review of patient's allergies indicates no known allergies.  Home Medications   Prior to Admission medications   Medication Sig Start Date End Date Taking? Authorizing Provider  acetaminophen (TYLENOL) 500 MG tablet Take 500-2,000 mg by mouth 2 (two) times daily as needed for moderate pain.    Historical Provider, MD  allopurinol (ZYLOPRIM) 300 MG tablet Take 300 mg by mouth daily.    Historical Provider, MD  ALPRAZolam Prudy Feeler) 1 MG tablet Take 1 mg by mouth 3 (three) times daily as needed for anxiety or sleep.     Historical Provider, MD  citalopram (CELEXA) 10 MG tablet Take 1 tablet (10 mg total) by mouth daily. 10/26/15   Derwood Kaplan, MD  diltiazem (CARDIZEM) 60 MG tablet Take 180 mg by mouth 2 (two) times daily.  04/06/13  Historical Provider, MD  lisinopril (PRINIVIL,ZESTRIL) 40 MG tablet Take 40 mg by mouth daily.    Historical Provider, MD  meloxicam (MOBIC) 15 MG tablet Take 15 mg by mouth daily.    Historical Provider, MD  metoprolol tartrate (LOPRESSOR) 25 MG tablet Take 25 mg by mouth 2 (two) times daily.    Historical Provider, MD  omeprazole (PRILOSEC) 40 MG capsule Take 40 mg by mouth daily.    Historical Provider, MD  pravastatin (PRAVACHOL) 40 MG tablet Take 40 mg by mouth daily.    Historical Provider, MD  torsemide (DEMADEX) 20 MG tablet Take 20 mg by mouth daily as needed (for swelling).     Historical Provider, MD  warfarin (COUMADIN) 5 MG tablet Take 2.5-5 mg by mouth daily. Take 5mg  qd on Tuesdays and  Fridays, and take 2.5mg  qd on Monday, Wednesday, Thursday , Saturday and Sunday    Historical Provider, MD   BP 189/108 mmHg  Pulse 99  Temp(Src) 98.9 F (37.2 C) (Rectal)  Resp 12  SpO2 97% Physical Exam  Constitutional: He is oriented to person, place, and time.  Obese, chronically ill appearing, NAD  HENT:  Right Ear: External ear normal.  Left Ear: External ear normal.  Nose: Nose normal.  Mouth/Throat: Oropharynx is clear and moist. Mucous membranes are dry. No oropharyngeal exudate.  Eyes: Conjunctivae and EOM are normal. Pupils are equal, round, and reactive to light.  Neck: Normal range of motion. Neck supple.  Cardiovascular: Regular rhythm, normal heart sounds and intact distal pulses.  Tachycardia present.   Pulmonary/Chest: Effort normal and breath sounds normal. No respiratory distress. He has no wheezes. He exhibits no tenderness.  Abdominal: Soft. He exhibits no distension. Bowel sounds are increased. There is no tenderness. There is no rebound and no guarding.  Genitourinary:  Erythematous plaques with some maceration, some scaling in inguinal folds, scrotal skin, folds of abdominal panna.   No scrotal edema or tenderness.  Musculoskeletal: He exhibits no edema.  Neurological: He is alert and oriented to person, place, and time. No cranial nerve deficit.  Skin: Skin is warm and dry.  Psychiatric: He has a normal mood and affect.  Nursing note and vitals reviewed.  Filed Vitals:   10/30/15 1308 10/30/15 1352 10/30/15 1416 10/30/15 1532  BP: 153/68 144/75 135/85 155/97  Pulse: 135 122 120 121  Temp:    98.3 F (36.8 C)  TempSrc:    Oral  Resp: 20 21 17 21   SpO2: 97% 96% 97% 98%     ED Course  Procedures (including critical care time) Labs Review Labs Reviewed  COMPREHENSIVE METABOLIC PANEL - Abnormal; Notable for the following:    Potassium 2.9 (*)    Chloride 96 (*)    Glucose, Bld 130 (*)    ALT 11 (*)    All other components within normal limits   URINALYSIS, ROUTINE W REFLEX MICROSCOPIC (NOT AT [facility]) - Abnormal; Notable for the following:    Glucose, UA 100 (*)    Hgb urine dipstick TRACE (*)    All other components within normal limits  LACTIC ACID, PLASMA - Abnormal; Notable for the following:    Lactic Acid, Venous 2.2 (*)    All other components within normal limits  PROTIME-INR - Abnormal; Notable for the following:    Prothrombin Time 20.9 (*)    INR 1.80 (*)    All other components within normal limits  URINE MICROSCOPIC-ADD ON - Abnormal; Notable for the following:  Squamous Epithelial / LPF 0-5 (*)    Bacteria, UA RARE (*)    All other components within normal limits  URINE CULTURE  CBC WITH DIFFERENTIAL/PLATELET  LACTIC ACID, PLASMA  I-STAT TROPOININ, ED  I-STAT CHEM 8, ED    Imaging Review Ct Abdomen Pelvis W Contrast  10/30/2015  CLINICAL DATA:  Mole a ease since last night EXAM: CT ABDOMEN AND PELVIS WITH CONTRAST TECHNIQUE: Multidetector CT imaging of the abdomen and pelvis was performed using the standard protocol following bolus administration of intravenous contrast. CONTRAST:  50mL OMNIPAQUE IOHEXOL 300 MG/ML SOLN, OMNIPAQUE IOHEXOL 300 MG/ML SOLN COMPARISON:  12/06/2006 FINDINGS: Minimal dependent atelectasis. Liver, spleen, and adrenal glands are within normal limits. Nonspecific calcifications in the pancreatic body are slightly more prominent. There is no associated mass. This likely is related to chronic pancreatitis. Gallstones are present. The gallbladder is not distended but there is some thickening of the gallbladder wall. Several hypodensities are scattered throughout the kidneys. The larger ones are characterized as simple cysts. Small ones are too small to characterize. These are more prominent than on the prior study from 9 years prior. Indeterminate complex lesion deep to the cecum is smaller supporting benign etiology. Bladder and prostate are within normal limits. No free-fluid.  No abnormal  adenopathy. There are calcifications within the left renal vein. 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 finding. Collateral venous structures are seen extending to the splenic vein. No vertebral compression deformity. Advanced degenerative changes in the lumbar spine. IMPRESSION: Gallstones. There is nonspecific gallbladder wall thickening. The gallbladder is nondistended. Ultrasound can be performed as clinically indicated. Chronic changes. Electronically Signed   By: Jolaine Click M.D.   On: 10/30/2015 08:36   I have personally reviewed and evaluated these images and lab results as part of my medical decision-making.   EKG Interpretation   Date/Time:  Sunday October 30 2015 07:27:40 EST Ventricular Rate:  109 PR Interval:    QRS Duration: 156 QT Interval:  352 QTC Calculation: 474 R Axis:   -72 Text Interpretation:  Atrial fibrillation RBBB and LAFB Confirmed by  [facility]  MD, APRIL ([id number]) on 10/30/2015 7:31:33 AM       EKG Interpretation  Date/Time:  Sunday October 30 2015 12:07:01 EST Ventricular Rate:  136 PR Interval:    QRS Duration: 160 QT Interval:  346 QTC Calculation: 520 R Axis:   -70 Text Interpretation:  Atrial fibrillation with rapid ventricular response RBBB and LAFB Abnormal ekg Confirmed by Bebe Shaggy  MD, Dorinda Hill [phone]) on 10/30/2015 12:11:59 PM   MDM   Final diagnoses:  Atrial fibrillation with RVR (HCC)  Hypokalemia  Anxiety    UA unremarkable with trace hgb and rare bacteria; will send for culture. Lactic acid 2.2. Abdominal exam reveals hyperactive bowel sounds x 4 quadrants but no tenderness, guarding, rigidity. Non peritoneal. Given elevated lactic will obtain CT abd pelvs. Pt is tachycardic to the low 100s-110s. He denies chest pain, SOB. He does endorse anxiety which is likely contributing to his elevated HR. He also appears dry. Will give 1L NS infusion. CMP reveals K+ of 2.9. EKG completely unchanged from  prior with stable afib, RBBB, LAFB. Will give K+ IV and PO. Pt was seen and evaluated by attending MD Palumbo as well. Suspect likely combination medication reaction to Celexa and some withdrawal symptoms given pt's recent abrupt discontinuation of Xanax which he has been taking for 20 years. Pending negative CT  abd/pelvis and improvement in lactic acid and potassium anticipate d/c home with close PCP and psych follow up.   On reassessment pt tearful, stating he feels terrified of going home and states he cannot go home. He states he feels very weak. He states he is still nauseated. I explained to him that he is likely withdrawing from Xanax. Discussed that medically his workup is unremarkable. Lactic acid has improved with hydration. Will give phenergan, re-check BMP, and re-assess. He states he has PCP appointment tomorrow.  On reassessment continues to be extremely anxious. I discussed with him that I can give him short course of PO ativan and first dose in the ED, especially since he has PCP f/u scheduled tomorrow. He is again adamant that he can't go home. States he feels too anxious and petrified of going home. He states repeatedly "I'm dying, I'm dying." Discussed with attending MD Wickline. At this point pt is medically stable. Mild hypokalemia which will need repletion, PO BID x 5 days. Will move to psych and consult TTS.   TTS cleared. Pt stating he feels unsafe going home, wife cannot take care of him. SW consulted.  Pt has been persistently tachycardic to the 130s for the past 30-40 minutes. EKG obtained, now afib with RVR. Will give diltiazem loading dose 10mg  and start on infusion. Will call hospitalist for admission.  Spoke to Dr. Robb Matar who will admit pt to tele. SW has been in to see pt as well and will work with him regarding placement after discharge.   Carlene Coria, Cordelia Poche 10/30/15 1559  April Palumbo, MD 10/30/15 2342

## 2015-10-30 NOTE — ED Notes (Signed)
He remains anxious and very paranoid and hallucinating.  He states "They're talking on the other side of that wall and they're doing a news report and are gonna take all of our stuff."  Dr. Robb Matar has just seen him and I have just given IV Ativan and metoprolol.  His wife remains with him.  Monitor continues to show a fib with average heart rate at 120.

## 2015-10-30 NOTE — BH Assessment (Addendum)
Assessment Note  Ryan Barker is an 70 y.o. male presenting to [facility] voluntarily. Patient states that last night he woke up and "was peeing down my leg" and he woke his wife up who called 911 and EMS came out and he cannot remember what else happened.  Patient states that he is "not capable" of going home because "I am physically dying because I can't take care of myself." patient states that he was previously in the ED and his record indicates that he was at [facility] 10/23/2015 and patient states that he had nurses care when he was here. Patient states that he was discharged and nurses come out to the his home but he does not feel that is enough and would like to stay in the hospital where he could have nursing care continuously. Asked patient if he would like placement in a facility and patient states "no, I need inpatient care so i can be well again." Patient appears to have problems with his memory as he would start answering a questions and then say "what was the question again?" Patient states that he cannot remember things because he was taken off of Xanax after taken it for "30 years." Patient states that he is unable to care for himself and his wife cannot care for him either. Patient states that he is depressed and anxious due to not being able to care for himself. Patient states that he has not slept since he was discharged this past Tuesday. Patient states that he is tired during the day but denies other symptoms of depression.  Patient denies SI or previous attempts. Patient denies self-injurious behaviors. Patient denies family history of suicide. Patient denies HI and history of aggression. Patient denies history of abuse. Patient states that there are weapons in his home but they are locked away in a place where only his wife has the key and he does not have access.  Patient denies AVH and does not appear to be responding to internal stimuli.   Patient is alert and oriented x4 at the start of the  assessment, however, patient appears forgetful throughout the assessment. Patient would start to answer a question and then ask this writer to repeat the question. Patient would then answer the question appropriately. Patient was asked about HI and stated "earlier you asked about power of attorney, my wife and I did that last year, I don't think I answered that question correctly." Then patient asked "what was the new question?" and then answered the questions appropriately.  Patient denies previous inpatient or outpatient psychiatric hospitalizations.  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   Consulted with Dahlia Byes, PMH-NP who states that patient does not meet inpatient criteria at this time. Patient will be admitted medically per EDP.   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 Gout   . HTN (hypertension)   . Depressive disorder   . Atrial fibrillation (HCC)   . Osteoarthritis   . Obesity   . GERD (gastroesophageal reflux disease)   . Hypercholesteremia   . Carpal tunnel syndrome   . Legally blind in left eye, as defined in [place]   . Hypercholesteremia   . 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  They did surgery on  that 15 yrs ago."    Past Surgical History  Procedure Laterality Date  . Tonsillectomy    . Inguinal hernia repair      Family History: History reviewed. No pertinent family history.  Social History:  reports that he has never smoked. He does not have any smokeless tobacco history on file. He reports that he does not drink alcohol or use illicit drugs.  Additional Social History:  Alcohol / Drug Use Pain Medications: See PTA Prescriptions: See PTA Over the Counter: See PTA Longest period of sobriety (when/how long): 14 years Substance  #1 Name of Substance 1: alcohol 1 - Age of First Use: 15 1 - Amount (size/oz): 1/5th or more daily 1 - Frequency: daily 1 - Duration: ongoing 1 - Last Use / Amount: 14 years  CIWA: CIWA-Ar BP: 156/93 mmHg Pulse Rate: 72 COWS:    Allergies: No Known Allergies  Home Medications:  (Not in a hospital admission)  OB/GYN Status:  No LMP for male patient.  General Assessment Data Location of Assessment: [facility] TTS Assessment: In system Is this a Tele or Face-to-Face Assessment?: Face-to-Face Is this an Initial Assessment or a Re-assessment for this encounter?: Initial Assessment Marital status: Married (11 years) Jordan name: N/A Is patient pregnant?: No Pregnancy Status: No Living Arrangements: Spouse/significant other Can pt return to current living arrangement?: Yes Admission Status: Voluntary Is patient capable of signing voluntary admission?: Yes Referral Source: MD Insurance type: Medicare     Crisis Care Plan Living Arrangements: Spouse/significant other Name of Psychiatrist: None Name of Therapist: None  Education Status Is patient currently in school?: No Highest grade of school patient has completed: some college  Risk to self with the past 6 months Suicidal Ideation: No Has patient been a risk to self within the past 6 months prior to admission? : No Suicidal Intent: No Has patient had any suicidal intent within the past 6 months prior to admission? : No Is patient at risk for suicide?: No Suicidal Plan?: No Has patient had any suicidal plan within the past 6 months prior to admission? : No Access to Means: No What has been your use of drugs/alcohol within the last 12 months?: denies Previous Attempts/Gestures: No How many times?: 0 Other Self Harm Risks: Denies Triggers for Past Attempts: None known Intentional Self Injurious Behavior: None Family Suicide History: No Recent stressful life event(s): Other (Comment) (conflict with family - wife's  daughter and her husband) Persecutory voices/beliefs?: No Depression: Yes Depression Symptoms: Insomnia, Fatigue Substance abuse history and/or treatment for substance abuse?: Yes Suicide prevention information given to non-admitted patients: Not applicable  Risk to Others within the past 6 months Homicidal Ideation: No Does patient have any lifetime risk of violence toward others beyond the six months prior to admission? : No Thoughts of Harm to Others: No Current Homicidal Intent: No Current Homicidal Plan: No Access to Homicidal Means: No Identified Victim: Denies History of harm to others?: No Assessment of Violence: None Noted Violent Behavior Description: Denies Does patient have access to weapons?: Yes (Comment) (locked in a safe, he does not have access to safe) Criminal Charges Pending?: No Does patient have a court date: No Is patient on probation?: No  Psychosis Hallucinations: None noted Delusions: None noted  Mental Status Report Appearance/Hygiene: Unremarkable Eye Contact: Good Motor Activity: Unable to assess Speech: Logical/coherent, Slow Level of Consciousness: Alert Mood: Pleasant Affect: Appropriate to circumstance Thought Processes: Coherent, Relevant Judgement: Unimpaired Orientation: Person, Place, Time, Situation, Appropriate for developmental  age Obsessive Compulsive Thoughts/Behaviors: None  Cognitive Functioning Concentration: Decreased Memory: Unable to Assess IQ: Average Insight: Fair Impulse Control: Fair Appetite: Good Sleep: Decreased Total Hours of Sleep:  ("I haven't slept since Tuesday") Vegetative Symptoms: None  ADLScreening [facility] Assessment Services) Patient's cognitive ability adequate to safely complete daily activities?: Yes Patient able to express need for assistance with ADLs?: Yes Independently performs ADLs?: No  Prior Inpatient Therapy Prior Inpatient Therapy: No Prior Therapy Dates: N/A Prior Therapy  Facilty/Provider(s): N/A Reason for Treatment: N/A  Prior Outpatient Therapy Prior Outpatient Therapy: No Prior Therapy Dates: N/A Prior Therapy Facilty/Provider(s): N/A Reason for Treatment: N/A Does patient have an ACCT team?: No Does patient have Intensive In-House Services?  : No Does patient have [facility] services? : No Does patient have P4CC services?: No  ADL Screening (condition at time of admission) Patient's cognitive ability adequate to safely complete daily activities?: Yes Is the patient deaf or have difficulty hearing?: No Does the patient have difficulty seeing, even when wearing glasses/contacts?: No Does the patient have difficulty concentrating, remembering, or making decisions?: No Patient able to express need for assistance with ADLs?: Yes Does the patient have difficulty dressing or bathing?: Yes Independently performs ADLs?: No Does the patient have difficulty walking or climbing stairs?: Yes Weakness of Legs: None Weakness of Arms/Hands: None  Home Assistive Devices/Equipment Home Assistive Devices/Equipment: Cane (specify quad or straight), Walker (specify type), Shower chair without back    Abuse/Neglect Assessment (Assessment to be complete while patient is alone) Physical Abuse: Denies Verbal Abuse: Denies Sexual Abuse: Denies Exploitation of patient/patient's resources: Denies Self-Neglect: Denies Values / Beliefs Cultural Requests During Hospitalization: None Spiritual Requests During Hospitalization: None Consults Spiritual Care Consult Needed: No Social Work Consult Needed: No Merchant navy officer (For Healthcare) Does patient have an advance directive?: Yes Type of Advance Directive: Midwife Does patient want to make changes to advanced directive?: No - Patient declined Copy of advanced directive(s) in chart?: Yes    Additional Information 1:1 In Past 12 Months?: No CIRT Risk: No Elopement Risk: No Does patient have  medical clearance?: Yes     Disposition:  Disposition Initial Assessment Completed for this Encounter: Yes Disposition of Patient: Other dispositions (pt does not meet inpatient criteria per Dahlia Byes, NP)  On Site Evaluation by:   Reviewed with Physician:    Raiya Stainback 10/30/2015 12:08 PM

## 2015-10-30 NOTE — ED Notes (Signed)
He tells me that he had been on Xanax "for years until 4 days ago when the doctor here stopped it".  I inform him and his wife that this may cause withdrawal symptoms which can by quite pronounced.  They thank mr for the information.  As I write this, our TTS evaluator is speaking with him.

## 2015-10-30 NOTE — ED Notes (Signed)
I had notified Dr. Bebe Shaggy about 30 min. Ago of pt. Sustained average heart rate of ~135--order rec'd. For EKG, which was performed.  Pt. Remains anxious and in no distress.

## 2015-10-30 NOTE — ED Provider Notes (Signed)
Pt now with afib with RVR  EKG Interpretation  Date/Time:  Sunday October 30 2015 12:07:01 EST Ventricular Rate:  136 PR Interval:    QRS Duration: 160 QT Interval:  346 QTC Calculation: 520 R Axis:   -70 Text Interpretation:  Atrial fibrillation with rapid ventricular response RBBB and LAFB Abnormal ekg Confirmed by Bebe Shaggy  MD, Chrisangel Eskenazi ([id number]) on 10/30/2015 12:11:59 PM      He will need admission   Zadie Rhine, MD 10/30/15 1212

## 2015-10-30 NOTE — ED Notes (Signed)
He is a bit anxious and tells me he "just don't feel well".  He is in no distress and his wife stays with him at all times.

## 2015-10-30 NOTE — H&P (Signed)
[facility] History and Physical  JENSON BEEDLE ZOX:[id number] DOB: 1945-12-03 DOA: 10/30/2015  Referring physician: Carlene Coria, PA-C PCP: Thora Lance, MD   Chief Complaint: Scrotum swelling.  HPI: Ryan Barker is a 70 y.o. male with a Past medical history as bellow who is brought from home via EMS to the emergency department due to a scrotal swelling and incontinence.   Per patient, he has a "hidden penis" and states that when he urinates his urine is staying in his scrotum instead flowing out, causing his scrotum to swell. The patient also has been hallucinating and stating that the police is near and that they are about to arrest him for insurance fraud and charge him with a felony. His wife states that he was recently tapered off very quickly from alprazolam, which he has been using for anxiety for the past 20 years. She is states that he has been increasingly confused and delusional. He also had not taken his metoprolol and Cardizem since yesterday and was found to be in atrial fibrillation with RVR.   Workup in the emergency department is significant for hypokalemia, hypomagnesemia, hypophosphatemia and mild lactic acidosis.    Review of Systems:  Unable to review due to the patient's mental status.   Past Medical History  Diagnosis Date  . Gout   . HTN (hypertension)   . Depressive disorder   . Atrial fibrillation (HCC)   . Osteoarthritis   . Obesity   . GERD (gastroesophageal reflux disease)   . Hypercholesteremia   . Carpal tunnel syndrome   . Legally blind in left eye, as defined in [place]   . Hypercholesteremia   . 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  They did surgery on that 15 yrs ago."   Past Surgical History  Procedure Laterality Date  . Tonsillectomy    . Inguinal hernia repair     Social History:  reports that he has never smoked. He does not have any smokeless tobacco history on file. He reports that he  does not drink alcohol or use illicit drugs.  No Known Allergies  History reviewed. No pertinent family history.   Prior to Admission medications   Medication Sig Start Date End Date Taking? Authorizing Provider  acetaminophen (TYLENOL) 650 MG CR tablet Take 1,300 mg by mouth every 8 (eight) hours as needed for pain.   Yes Historical Provider, MD  allopurinol (ZYLOPRIM) 300 MG tablet Take 300 mg by mouth daily.   Yes Historical Provider, MD  citalopram (CELEXA) 10 MG tablet Take 1 tablet (10 mg total) by mouth daily. 10/26/15  Yes Derwood Kaplan, MD  diltiazem (CARDIZEM) 60 MG tablet Take 180 mg by mouth 2 (two) times daily.  04/06/13  Yes Historical Provider, MD  lisinopril (PRINIVIL,ZESTRIL) 40 MG tablet Take 40 mg by mouth daily.   Yes Historical Provider, MD  meloxicam (MOBIC) 15 MG tablet Take 15 mg by mouth daily.   Yes Historical Provider, MD  metoprolol tartrate (LOPRESSOR) 25 MG tablet Take 25 mg by mouth 2 (two) times daily.   Yes Historical Provider, MD  omeprazole (PRILOSEC) 40 MG capsule Take 40 mg by mouth daily.   Yes Historical Provider, MD  pravastatin (PRAVACHOL) 40 MG tablet Take 40 mg by mouth daily.   Yes Historical Provider, MD  torsemide (DEMADEX) 20 MG tablet Take 20 mg by mouth daily as needed (for swelling).    Yes Historical Provider, MD  warfarin (COUMADIN) 5 MG tablet Take 2.5-5 mg by mouth daily. Take  ( 1 tablet) on Tuesdays and Fridays . Then take 2.5mg  ( one-half ) of a tablet on Monday, Wednesday, Thursday , Saturday and Sunday   Yes Historical Provider, MD   Physical Exam: Filed Vitals:   10/30/15 1308 10/30/15 1352 10/30/15 1416 10/30/15 1532  BP: 153/68 144/75 135/85 155/97  Pulse: 135 122 120 121  Temp:    98.3 F (36.8 C)  TempSrc:    Oral  Resp: SpO2: 97% 96% 97% 98%    Wt Readings from Last 3 Encounters:  07/05/15 140.071 kg (308 lb 12.8 oz)  06/29/14 142.429 kg (314 lb)  06/24/13 147.873 kg (326 lb)    General:  Anxious and  delusional. Eyes: PERRL, normal lids, irises & conjunctiva ENT: grossly normal hearing, lips and oral mucosa are mildly dry. Neck: no LAD, masses or thyromegaly Cardiovascular: Irregularly irregular, tachycardic, no m/r/g. 1+ LE edema. Telemetry: A. fib with RVR. Respiratory: CTA bilaterally, no w/r/r. Normal respiratory effort. Abdomen: soft, ntnd GU: Non-protuberant penis, no significant scrotal edema, no tenderness. Skin: Significant erythema with macerated skin and foul-smelling discharge on abdominal          panniculus and inguinal folds. Musculoskeletal: grossly normal tone BUE/BLE Psychiatric: Tremulous, anxious, positive delusions of police about to charge him with a                             felony. Neurologic: Awake, alert, confused and delirious, moves all extremities.           Labs on Admission:  Basic Metabolic Panel:  Recent Labs Lab 10/23/15 1837 10/30/15 0539 10/30/15 1109  NA 140 135 138  K 3.5 2.9* 3.3*  CL 101 96* 95*  CO2 25 26  --   GLUCOSE 106* 130* 118*  BUN 21* 16 12  CREATININE 1.27* 0.97 0.70  CALCIUM 9.2 9.0  --    Liver Function Tests:  Recent Labs Lab 10/23/15 1837 10/30/15 0539  AST 22 26  ALT 10* 11*  ALKPHOS 90 93  BILITOT 1.3* 0.8  PROT 7.1 7.5  ALBUMIN 4.2 4.5   CBC:  Recent Labs Lab 10/23/15 1837 10/30/15 0539 10/30/15 1109  WBC 7.4 8.8  --   NEUTROABS 5.4 6.8  --   HGB 14.4 15.6 16.7  HCT 42.7 45.7 49.0  MCV 96.6 97.4  --   PLT 207 253  --     Radiological Exams on Admission: Ct Abdomen Pelvis W Contrast  10/30/2015  CLINICAL DATA:  Mole a ease since last night EXAM: CT ABDOMEN AND PELVIS WITH CONTRAST TECHNIQUE: Multidetector CT imaging of the abdomen and pelvis was performed using the standard protocol following bolus administration of intravenous contrast. CONTRAST:  50mL OMNIPAQUE IOHEXOL 300 MG/ML SOLN, OMNIPAQUE IOHEXOL 300 MG/ML SOLN COMPARISON:  12/06/2006 FINDINGS: Minimal dependent atelectasis.  Liver, spleen, and adrenal glands are within normal limits. Nonspecific calcifications in the pancreatic body are slightly more prominent. There is no associated mass. This likely is related to chronic pancreatitis. Gallstones are present. The gallbladder is not distended but there is some thickening of the gallbladder wall. Several hypodensities are scattered throughout the kidneys. The larger ones are characterized as simple cysts. Small ones are too small to characterize. These are more prominent than on the prior study from 9 years prior. Indeterminate complex lesion deep to the cecum is smaller  supporting benign etiology. Bladder and prostate are within normal limits. No free-fluid.  No abnormal adenopathy. There are calcifications within the left renal vein. 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 finding. Collateral venous structures are seen extending to the splenic vein. No vertebral compression deformity. Advanced degenerative changes in the lumbar spine. IMPRESSION: Gallstones. There is nonspecific gallbladder wall thickening. The gallbladder is nondistended. Ultrasound can be performed as clinically indicated. Chronic changes. Electronically Signed   By: Jolaine Click M.D.   On: 10/30/2015 08:36    EKG: Independently reviewed.  Vent. rate 136 BPM PR interval * ms QRS duration 160 ms QT/QTc 346/520 ms P-R-T axes -1 -70 82 Atrial fibrillation with rapid ventricular response RBBB and LAFB Abnormal ekg   Assessment/Plan Principal Problem:   Atrial fibrillation with rapid ventricular response (HCC)  Admit to stepdown. Optimize electrolytes. Continue Cardizem infusion. Metoprolol IVP given as needed, since the patient had not taken his oral metoprolol since yesterday. Resume metoprolol 25 mg by mouth twice a day. Treat benzodiazepine withdrawal.     Long term current use of anticoagulant therapy Warfarin per pharmacy. PT/INR daily.  Active  Problems:   Benzodiazepine withdrawal with perceptual disturbance (HCC)   Dementia with behavioral disturbance   Depression due to dementia Continue benzodiazepine as needed for sedation. Patient recently was tapered off from his alprazolam and Celexa 10 mg was started. Continue as needed lorazepam and taper off benzodiazepine at a slower pace.    Essential hypertension, benign Continue metoprolol, lisinopril, and torsemide as needed. Monitor blood pressure.     Hypokalemia Replaced earlier. Follow-up potassium level.     Hypomagnesemia.  Replacing.    Dermatophytosis. Nystatin powder 3 times a day ordered.   Code Status: Full code. DVT Prophylaxis:On Warfarin. Family Communication: His wife was present in the room. Disposition: Admit to a stepdown for rate control and benzodiazepine withdrawal treatment.  Time spent: Over 70 minutes were spent in the process of this admission.  Bobette Mo, MD [facility] Pager [phone]

## 2015-10-31 ENCOUNTER — Inpatient Hospital Stay (HOSPITAL_COMMUNITY): Payer: Medicare Other

## 2015-10-31 DIAGNOSIS — G2579 Other drug induced movement disorders: Secondary | ICD-10-CM | POA: Diagnosis present

## 2015-10-31 DIAGNOSIS — F0391 Unspecified dementia with behavioral disturbance: Secondary | ICD-10-CM

## 2015-10-31 DIAGNOSIS — I10 Essential (primary) hypertension: Secondary | ICD-10-CM

## 2015-10-31 DIAGNOSIS — K219 Gastro-esophageal reflux disease without esophagitis: Secondary | ICD-10-CM | POA: Diagnosis present

## 2015-10-31 DIAGNOSIS — F13232 Sedative, hypnotic or anxiolytic dependence with withdrawal with perceptual disturbance: Secondary | ICD-10-CM

## 2015-10-31 DIAGNOSIS — F329 Major depressive disorder, single episode, unspecified: Secondary | ICD-10-CM

## 2015-10-31 DIAGNOSIS — F0280 Dementia in other diseases classified elsewhere without behavioral disturbance: Secondary | ICD-10-CM

## 2015-10-31 DIAGNOSIS — E876 Hypokalemia: Secondary | ICD-10-CM

## 2015-10-31 DIAGNOSIS — Z7901 Long term (current) use of anticoagulants: Secondary | ICD-10-CM

## 2015-10-31 DIAGNOSIS — E785 Hyperlipidemia, unspecified: Secondary | ICD-10-CM

## 2015-10-31 LAB — BASIC METABOLIC PANEL
Anion gap: 14 (ref 5–15)
BUN: 21 mg/dL — ABNORMAL HIGH (ref 6–20)
CHLORIDE: 101 mmol/L (ref 101–111)
CO2: 27 mmol/L (ref 22–32)
CREATININE: 1.22 mg/dL (ref 0.61–1.24)
Calcium: 9 mg/dL (ref 8.9–10.3)
GFR calc non Af Amer: 58 mL/min — ABNORMAL LOW (ref >60)
Glucose, Bld: 126 mg/dL — ABNORMAL HIGH (ref 65–99)
POTASSIUM: 3.8 mmol/L (ref 3.5–5.1)
SODIUM: 142 mmol/L (ref 135–145)

## 2015-10-31 LAB — CBC WITH DIFFERENTIAL/PLATELET
BASOS ABS: 0 10*3/uL (ref 0.0–0.1)
BASOS PCT: 0 %
EOS PCT: 1 %
Eosinophils Absolute: 0.1 10*3/uL (ref 0.0–0.7)
HEMATOCRIT: 43 % (ref 39.0–52.0)
Hemoglobin: 14.7 g/dL (ref 13.0–17.0)
LYMPHS PCT: 18 %
Lymphs Abs: 1.8 10*3/uL (ref 0.7–4.0)
MCH: 34 pg (ref 26.0–34.0)
MCHC: 34.2 g/dL (ref 30.0–36.0)
MCV: 99.5 fL (ref 78.0–100.0)
Monocytes Absolute: 1.4 10*3/uL — ABNORMAL HIGH (ref 0.1–1.0)
Monocytes Relative: 14 %
NEUTROS ABS: 6.8 10*3/uL (ref 1.7–7.7)
Neutrophils Relative %: 68 %
PLATELETS: 251 10*3/uL (ref 150–400)
RBC: 4.32 MIL/uL (ref 4.22–5.81)
RDW: 15.1 % (ref 11.5–15.5)
WBC: 10 10*3/uL (ref 4.0–10.5)

## 2015-10-31 LAB — COMPREHENSIVE METABOLIC PANEL
ALBUMIN: 4.2 g/dL (ref 3.5–5.0)
ALT: 12 U/L — AB (ref 17–63)
AST: 23 U/L (ref 15–41)
Alkaline Phosphatase: 83 U/L (ref 38–126)
Anion gap: 11 (ref 5–15)
BUN: 13 mg/dL (ref 6–20)
CHLORIDE: 100 mmol/L — AB (ref 101–111)
CO2: 28 mmol/L (ref 22–32)
CREATININE: 0.81 mg/dL (ref 0.61–1.24)
Calcium: 8.9 mg/dL (ref 8.9–10.3)
GFR calc Af Amer: >60 mL/min (ref >60)
GLUCOSE: 118 mg/dL — AB (ref 65–99)
Potassium: 3.7 mmol/L (ref 3.5–5.1)
Sodium: 139 mmol/L (ref 135–145)
Total Bilirubin: 1.1 mg/dL (ref 0.3–1.2)
Total Protein: 6.9 g/dL (ref 6.5–8.1)

## 2015-10-31 LAB — MAGNESIUM: Magnesium: 1.7 mg/dL (ref 1.7–2.4)

## 2015-10-31 LAB — PROTIME-INR
INR: 1.74 — ABNORMAL HIGH (ref 0.00–1.49)
PROTHROMBIN TIME: 20.3 s — AB (ref 11.6–15.2)

## 2015-10-31 LAB — URINE CULTURE

## 2015-10-31 LAB — TROPONIN I: Troponin I: 0.06 ng/mL — ABNORMAL HIGH (ref <0.031)

## 2015-10-31 LAB — TSH: TSH: 1.259 u[IU]/mL (ref 0.350–4.500)

## 2015-10-31 LAB — T4, FREE: Free T4: 1.25 ng/dL — ABNORMAL HIGH (ref 0.61–1.12)

## 2015-10-31 MED ORDER — METOPROLOL TARTRATE 1 MG/ML IV SOLN: 2.5000 mg | Freq: Four times a day (QID) | INTRAVENOUS | Status: DC

## 2015-10-31 MED ORDER — DIAZEPAM 5 MG PO TABS
5.0000 mg | ORAL_TABLET | Freq: Two times a day (BID) | ORAL | Status: DC
  Administered 2015-10-31 – 2015-11-04 (×8): 5 mg via ORAL
  Filled 2015-10-31 (×8): qty 1

## 2015-10-31 MED ORDER — HYDRALAZINE HCL 20 MG/ML IJ SOLN
10.0000 mg | Freq: Once | INTRAMUSCULAR | Status: AC
  Administered 2015-10-31: 10 mg via INTRAVENOUS
  Filled 2015-10-31: qty 1

## 2015-10-31 MED ORDER — LORAZEPAM 2 MG/ML IJ SOLN
1.0000 mg | INTRAMUSCULAR | Status: DC | PRN
  Administered 2015-10-31: 1 mg via INTRAVENOUS
  Filled 2015-10-31: qty 1

## 2015-10-31 MED ORDER — METOPROLOL TARTRATE 1 MG/ML IV SOLN: 2.5000 mg | INTRAVENOUS | Status: DC | PRN

## 2015-10-31 MED ORDER — LORAZEPAM 2 MG/ML IJ SOLN
2.0000 mg | INTRAMUSCULAR | Status: DC | PRN
  Administered 2015-10-31 (×2): 3 mg via INTRAVENOUS
  Administered 2015-10-31 (×2): 2 mg via INTRAVENOUS
  Administered 2015-10-31: 3 mg via INTRAVENOUS
  Administered 2015-11-01: 2 mg via INTRAVENOUS
  Administered 2015-11-01: 3 mg via INTRAVENOUS
  Administered 2015-11-01 (×2): 2 mg via INTRAVENOUS
  Administered 2015-11-01: 3 mg via INTRAVENOUS
  Administered 2015-11-01: 2 mg via INTRAVENOUS
  Administered 2015-11-01: 3 mg via INTRAVENOUS
  Administered 2015-11-03: 2 mg via INTRAVENOUS
  Filled 2015-10-31 (×2): qty 2
  Filled 2015-10-31: qty 1
  Filled 2015-10-31 (×2): qty 2
  Filled 2015-10-31 (×2): qty 1
  Filled 2015-10-31: qty 2
  Filled 2015-10-31: qty 1
  Filled 2015-10-31: qty 2
  Filled 2015-10-31 (×3): qty 1

## 2015-10-31 MED ORDER — LORAZEPAM 2 MG/ML IJ SOLN
1.0000 mg | Freq: Once | INTRAMUSCULAR | Status: DC
  Filled 2015-10-31: qty 1

## 2015-10-31 MED ORDER — WARFARIN SODIUM 6 MG PO TABS
6.0000 mg | ORAL_TABLET | Freq: Once | ORAL | Status: AC
  Administered 2015-10-31: 6 mg via ORAL
  Filled 2015-10-31: qty 1

## 2015-10-31 MED ORDER — ENSURE ENLIVE PO LIQD
237.0000 mL | Freq: Two times a day (BID) | ORAL | Status: DC
  Administered 2015-10-31 – 2015-11-07 (×14): 237 mL via ORAL

## 2015-10-31 MED ORDER — SODIUM CHLORIDE 0.9 % IV SOLN
INTRAVENOUS | Status: DC
  Administered 2015-10-31 – 2015-11-01 (×2): via INTRAVENOUS
  Administered 2015-11-01: 125 mL/h via INTRAVENOUS

## 2015-10-31 NOTE — Progress Notes (Signed)
[facility]  Patient Status: Active (receiving services up to time of hospitalization)  [facility] is providing the following services: RN, PT, OT, ST and MSW  If patient discharges after hours, please call [phone].   Ryan Barker 10/31/2015, 3:51 PM

## 2015-10-31 NOTE — Progress Notes (Signed)
[facility] Progress Note  Patient: Ryan Barker ZOX:[id number]   PCP: Thora Lance, MD DOB: April 26, 1946   DOA: 10/30/2015   DOS: 10/31/2015   Date of Service: the patient was seen and examined on 10/31/2015  Subjective: The patient although denies any acute complaint continues to have significant hallucination. He tells me that he is here for the opening of the facility, he tells me that his wife and one of her friend has started taking calls, he also tells me that there are people were laughing at him and his family member with a small kid just left the room before I walked in. Has tremors. And chronic right shoulder pain Nutrition: Tolerating oral diet Activity: Bedridden at present Last BM: 10/28/2015  Assessment and Plan: 1. Atrial fibrillation with rapid ventricular response [facility]) The patient is presenting with complains of hallucination. He is found to have A. fib with RVR. Known prior history of A. fib with chronic anticoagulation. We'll check echocardiogram. Check TSH and free T4. Continue Cardizem drip. Use IV Lopressor every 6 hours as needed for heart rate more than 120 despite Cardizem drip. Continue Lopressor 25 mg twice a day. Continue monitoring the step down unit at present.  2. Acute encephalopathy with psychosis and hallucinations. The patient is having significant auditory hallucination. Differential diagnoses include benzodiazepine withdrawal since the patient has been on Xanax 1 mg every 8 hours as needed for more than 20 years, and recently has been tapered off due to memory issue. Patient was also recently started on Celexa, one week ago, and his presentation can also be explained by serotonin syndrome. Regardless of treatment is similar. We will continue with IV fluids, use scheduled Valium every 12 hours. Lorazepam every 4 hours as needed for increased tremors and agitation. Discontinue Celexa. Once the patient is hemodynamically stable we will discuss with  psychiatry regarding further medical management. Given patient's acute presentation I would also check TSH and free T4, CT of the head to rule out any acute abnormality.  3. Dementia with depression. Currently holding Celexa. Continue benzodiazepine.  4. Hypokalemia, hypomagnesemia, lactic acidosis. All more likely secondary to dehydration. Continue IV fluids and currently has been replaced with continue to monitor.  5. essential hypertension. Currently holding lisinopril while the patient is receiving rate controlling medications.  6.dyslipidemia. Continuing statin.  7.GERD. On Protonix instead of omeprazole at home.  DVT Prophylaxis: on therapeutic anticoagulation. Nutrition: cardiac diet Advance goals of care discussion: full code  Brief Summary of Hospitalization:  HPI: As per the H and P dictated on admission, "Ryan Barker is a 70 y.o. male with a Past medical history as bellow who is brought from home via EMS to the emergency department due to a scrotal swelling and incontinence.   Per patient, he has a "hidden penis" and states that when he urinates his urine is staying in his scrotum instead flowing out, causing his scrotum to swell. The patient also has been hallucinating and stating that the police is near and that they are about to arrest him for insurance fraud and charge him with a felony. His wife states that he was recently tapered off very quickly from alprazolam, which he has been using for anxiety for the past 20 years. She is states that he has been increasingly confused and delusional. He also had not taken his metoprolol and Cardizem since yesterday and was found to be in atrial fibrillation with RVR.   Workup in the emergency department is significant for hypokalemia,  hypomagnesemia, hypophosphatemia and mild lactic acidosis." Daily update, Procedures: none Consultants: none Antibiotics: Anti-infectives    None      Family Communication: no family was  present at bedside, at the time of interview.  Disposition:  Barriers to safe discharge: Improvement in A. fib with RVR as well as mentation     Intake/Output Summary (Last 24 hours) at 10/31/15 1717 Last data filed at 10/31/15 1506  Gross per 24 hour  Intake 6261.84 ml  Output    350 ml  Net 5911.84 ml   Filed Weights   10/30/15 2200  Weight: 125.7 kg (277 lb 1.9 oz)    Objective: Physical Exam: Filed Vitals:   10/31/15 1500 10/31/15 1503 10/31/15 1530 10/31/15 1630  BP:  157/91 115/65 150/94  Pulse:  100 120 104  Temp: 97.4 F (36.3 C)     TempSrc:      Resp:  Height:      Weight:      SpO2:  97% 92% 94%     General: Appear in marked distress, persistent tremor as well as auditory hallucination  no Rash; Oral Mucosa moist Cardiovascular: S1 and S2 Present, no Murmur, no JVD Respiratory: Bilateral Air entry present and Clear to Auscultation, no Crackles, no wheezes Abdomen: Bowel Sound present, Soft and no tenderness Extremities: no Pedal edema, no calf tenderness Neurology: Grossly no focal neuro deficit.  Data Reviewed: CBC:  Recent Labs Lab 10/30/15 0539 10/30/15 1109 10/31/15 0134  WBC 8.8  --  10.0  NEUTROABS 6.8  --  6.8  HGB 15.6 16.7 14.7  HCT 45.7 49.0 43.0  MCV 97.4  --  99.5  PLT 253  --  251   Basic Metabolic Panel:  Recent Labs Lab 10/30/15 0539 10/30/15 1109 10/30/15 2036 10/31/15 0134  NA 135 138 138 139  K 2.9* 3.3* 3.6 3.7  CL 96* 95* 100* 100*  CO2 26  --  25 28  GLUCOSE 130* 118* 148* 118*  BUN CREATININE 0.97 0.70 0.83 0.81  CALCIUM 9.0  --  8.6* 8.9  MG 1.3*  --   --   --   PHOS 2.0*  --   --   --    Liver Function Tests:  Recent Labs Lab 10/30/15 0539 10/31/15 0134  AST 26 23  ALT 11* 12*  ALKPHOS 93 83  BILITOT 0.8 1.1  PROT 7.5 6.9  ALBUMIN 4.5 4.2   No results for input(s): LIPASE, AMYLASE in the last 168 hours. No results for input(s): AMMONIA in the last 168 hours.  Cardiac  Enzymes:  Recent Labs Lab 10/30/15 2036 10/31/15 0131  TROPONINI 0.06* 0.06*    BNP (last 3 results) No results for input(s): BNP in the last 8760 hours.  CBG: No results for input(s): GLUCAP in the last 168 hours.  Recent Results (from the past 240 hour(s))  Urine culture     Status: None   Collection Time: 10/23/15 10:42 PM  Result Value Ref Range Status   Specimen Description URINE, RANDOM  Final   Special Requests NONE  Final   Culture   Final    MULTIPLE SPECIES PRESENT, SUGGEST RECOLLECTION Performed at [facility]    Report Status 10/25/2015 FINAL  Final  Urine culture     Status: None   Collection Time: 10/30/15  5:48 AM  Result Value Ref Range Status   Specimen Description URINE, CLEAN CATCH  Final   Special  Requests NONE  Final   Culture   Final    MULTIPLE SPECIES PRESENT, SUGGEST RECOLLECTION Performed at [facility]    Report Status 10/31/2015 FINAL  Final  MRSA PCR Screening     Status: None   Collection Time: 10/30/15  9:13 PM  Result Value Ref Range Status   MRSA by PCR NEGATIVE NEGATIVE Final    Comment:        The GeneXpert MRSA Assay (FDA approved for NASAL specimens only), is one component of a comprehensive MRSA colonization surveillance program. It is not intended to diagnose MRSA infection nor to guide or monitor treatment for MRSA infections.      Studies: Dg Chest Port 1 View  10/31/2015  CLINICAL DATA:  Weight loss and shortness of Breath EXAM: PORTABLE CHEST 1 VIEW COMPARISON:  12/07/2006 FINDINGS: Cardiac shadow is enlarged. The lungs are well aerated bilaterally. No focal infiltrate or sizable effusion is seen. Degenerative changes of the shoulder joints left greater than right are seen. IMPRESSION: No acute abnormality noted. Electronically Signed   By: Alcide Clever M.D.   On: 10/31/2015 16:49     Scheduled Meds: . acetaminophen  650 mg Oral 3 times per day  . allopurinol  300 mg Oral Daily  . diazepam  5 mg  Oral BID  . feeding supplement (ENSURE ENLIVE)  237 mL Oral BID BM  . nystatin   Topical TID  . pantoprazole  40 mg Oral Daily  . pravastatin  40 mg Oral QHS  . Warfarin - Pharmacist Dosing Inpatient   Does not apply q1800   Continuous Infusions: . sodium chloride 125 mL/hr at 10/31/15 1637  . diltiazem (CARDIZEM) infusion 15 mg/hr (10/31/15 1502)   PRN Meds: LORazepam, metoprolol, ondansetron (ZOFRAN) IV  Time spent: 30 minutes  Author: Lynden Oxford, MD [facility] Pager: [phone] 10/31/2015 5:17 PM  If 7PM-7AM, please contact night-coverage at [email or web address], password [facility]

## 2015-10-31 NOTE — Progress Notes (Signed)
Initial Nutrition Assessment  DOCUMENTATION CODES:   Morbid obesity  INTERVENTION:   Continue Ensure Enlive po BID, each supplement provides 350 kcal and 20 grams of protein RD to continue to monitor  NUTRITION DIAGNOSIS:   Unintentional weight loss related to lethargy/confusion as evidenced by percent weight loss.  GOAL:   Patient will meet greater than or equal to 90% of their needs  MONITOR:   PO intake, Supplement acceptance, Labs, Weight trends, I & O's  REASON FOR ASSESSMENT:   Malnutrition Screening Tool    ASSESSMENT:   70 y.o. male with a Past medical history as bellow who is brought from home via EMS to the emergency department due to a scrotal swelling and incontinence.   Pt confused when RD visited at bedside. Pt did not want to speak with RD at this time. States "they are about to do a presentation. It is very important". RD asked pt if he would RD to return at a later time, he stated yes. Unable to obtain history at this time from patient. Suspect patient has not been eating well PTA d/t weight loss and Mg, K, Phos lab values at admission.  Per weight history, pt has lost 31 lb since October 2016 (10% wt loss x 4 months, significant for time frame). Unable to perform NFPE at this time. Visual assessment noted no muscle or fat depletion.  Labs and medications reviewed.  Diet Order:  Diet Heart Room service appropriate?: Yes; Fluid consistency:: Thin  Skin:  Reviewed, no issues  Last BM:  2/17  Height:   Ht Readings from Last 1 Encounters:  10/30/15  (1.753 m)    Weight:   Wt Readings from Last 1 Encounters:  10/30/15 277 lb 1.9 oz (125.7 kg)    Ideal Body Weight:  72.7 kg  BMI:  Body mass index is 40.9 kg/(m^2).  Estimated Nutritional Needs:   Kcal:  2150-2350  Protein:  90-100g  Fluid:  2.1L/day  EDUCATION NEEDS:   No education needs identified at this time  Tilda Franco, MS, RD, LDN Pager: [phone] After Hours Pager:  [phone]

## 2015-10-31 NOTE — Care Management Note (Signed)
Case Management Note  Patient Details  Name: Ryan Barker MRN: [id number] Date of Birth: 03-30-1946  Subjective/Objective:          arrhthymias with hypotension          Action/Plan:Date: October 31, 2015 Chart reviewed for concurrent status and case management needs. Will continue to follow patient for changes and needs: Marcelle Smiling, BSN, RN, [place]   [phone]   Expected Discharge Date:                  Expected Discharge Plan:  Home/Self Care  In-House Referral:  NA  Discharge planning Services  CM Consult  Post Acute Care Choice:  NA Choice offered to:  NA  DME Arranged:    DME Agency:     HH Arranged:    HH Agency:     Status of Service:  Completed, signed off  Medicare Important Message Given:    Date Medicare IM Given:    Medicare IM give by:    Date Additional Medicare IM Given:    Additional Medicare Important Message give by:     If discussed at Long Length of Stay Meetings, dates discussed:    Additional Comments:  Golda Acre, RN 10/31/2015, 9:01 AM

## 2015-10-31 NOTE — Progress Notes (Signed)
ANTICOAGULATION CONSULT NOTE - Follow up  Pharmacy Consult for Warfarin Indication: atrial fibrillation  No Known Allergies  Patient Measurements: Height:  (175.3 cm) Weight: 277 lb 1.9 oz (125.7 kg) IBW/kg (Calculated) : 70.7  Vital Signs: Temp: 99.7 F (37.6 C) (02/20 0800) Temp Source: Oral (02/20 0458) BP: 171/90 mmHg (02/20 0721) Pulse Rate: 72 (02/20 0721)  Labs:  Recent Labs  10/30/15 0539 10/30/15 1109 10/30/15 2036 10/31/15 0131 10/31/15 0134  HGB 15.6 16.7  --   --  14.7  HCT 45.7 49.0  --   --  43.0  PLT 253  --   --   --  251  LABPROT 20.9*  --   --   --  20.3*  INR 1.80*  --   --   --  1.74*  CREATININE 0.97 0.70 0.83  --  0.81  TROPONINI  --   --  0.06* 0.06*  --     Estimated Creatinine Clearance: 109.7 mL/min (by C-G formula based on Cr of 0.81).  Medications:  Scheduled:  . acetaminophen  650 mg Oral 3 times per day  . allopurinol  300 mg Oral Daily  . citalopram  10 mg Oral Daily  . feeding supplement (ENSURE ENLIVE)  237 mL Oral BID BM  . LORazepam  1 mg Intravenous Once  . magnesium sulfate 1 - 4 g bolus IVPB  2 g Intravenous Once  . metoprolol  5 mg Intravenous Once  . metoprolol tartrate  25 mg Oral BID  . nystatin   Topical TID  . pantoprazole  40 mg Oral Daily  . phosphorus  500 mg Oral TID  . pravastatin  40 mg Oral QHS  . Warfarin - Pharmacist Dosing Inpatient   Does not apply q1800   Infusions:  . diltiazem (CARDIZEM) infusion 15 mg/hr (10/31/15 0500)    Assessment: 69 yoM presented to [facility] ED on 2/19 with scrotal swelling, anxiety.  His PMH includes atrial fibrillation on chronic warfarin anticoagulation.  He is noted to have Afib with RVR.  Pharmacy is consulted to continue warfarin dosing inpatient.  Home warfarin dose is reported as 2.5 mg daily except  on Tuesday/Fridays.  Last dose was 2/18.  Admission INR is 1.8.  Today, 10/31/2015:  INR not rising yet after  last night.   CBC: Hgb and Plt WNL  Diet: heart  healthy  No major drug-drug interactions noted  Goal of Therapy:  INR 2-3 Monitor platelets by anticoagulation protocol: Yes   Plan:   Give warfarin  PO today and give early at 1400.  If INR is not >2 by 2/21, consider Lovenox /kg sq q12h.   Daily INR and CBC  Charolotte Eke, PharmD, pager [phone]. 10/31/2015,11:01 AM.

## 2015-10-31 NOTE — Progress Notes (Addendum)
CRITICAL VALUE ALERT  Critical value received:Troponin 0.64  Date of notification: 02/19  Time of notification:  0225  Critical value read back:Yes.    Nurse who received alert:  Reuel Boom  MD first page: Tama Gander, NP  Time of first page:0225  MD notified (2nd page):  Time of second page:  Comment: Responding MD:    No new order received

## 2015-11-01 ENCOUNTER — Inpatient Hospital Stay (HOSPITAL_COMMUNITY): Payer: Medicare Other

## 2015-11-01 DIAGNOSIS — I4891 Unspecified atrial fibrillation: Secondary | ICD-10-CM

## 2015-11-01 LAB — BASIC METABOLIC PANEL
ANION GAP: 10 (ref 5–15)
BUN: 18 mg/dL (ref 6–20)
CALCIUM: 8.5 mg/dL — AB (ref 8.9–10.3)
CO2: 30 mmol/L (ref 22–32)
CREATININE: 1.01 mg/dL (ref 0.61–1.24)
Chloride: 100 mmol/L — ABNORMAL LOW (ref 101–111)
Glucose, Bld: 111 mg/dL — ABNORMAL HIGH (ref 65–99)
Potassium: 3.4 mmol/L — ABNORMAL LOW (ref 3.5–5.1)
SODIUM: 140 mmol/L (ref 135–145)

## 2015-11-01 LAB — MAGNESIUM: Magnesium: 1.6 mg/dL — ABNORMAL LOW (ref 1.7–2.4)

## 2015-11-01 LAB — CBC
HEMATOCRIT: 41.8 % (ref 39.0–52.0)
Hemoglobin: 13.8 g/dL (ref 13.0–17.0)
MCH: 33.5 pg (ref 26.0–34.0)
MCHC: 33 g/dL (ref 30.0–36.0)
MCV: 101.5 fL — ABNORMAL HIGH (ref 78.0–100.0)
PLATELETS: 207 10*3/uL (ref 150–400)
RBC: 4.12 MIL/uL — ABNORMAL LOW (ref 4.22–5.81)
RDW: 15.5 % (ref 11.5–15.5)
WBC: 6.2 10*3/uL (ref 4.0–10.5)

## 2015-11-01 LAB — PROTIME-INR
INR: 2.22 — AB (ref 0.00–1.49)
PROTHROMBIN TIME: 24.4 s — AB (ref 11.6–15.2)

## 2015-11-01 MED ORDER — METOPROLOL TARTRATE 25 MG PO TABS
25.0000 mg | ORAL_TABLET | Freq: Three times a day (TID) | ORAL | Status: DC
  Administered 2015-11-01 – 2015-11-07 (×19): 25 mg via ORAL
  Filled 2015-11-01 (×20): qty 1

## 2015-11-01 MED ORDER — MAGNESIUM SULFATE 2 GM/50ML IV SOLN
2.0000 g | Freq: Once | INTRAVENOUS | Status: AC
  Administered 2015-11-01: 2 g via INTRAVENOUS
  Filled 2015-11-01: qty 50

## 2015-11-01 MED ORDER — DILTIAZEM HCL 60 MG PO TABS
60.0000 mg | ORAL_TABLET | Freq: Four times a day (QID) | ORAL | Status: DC
  Administered 2015-11-01 – 2015-11-03 (×7): 60 mg via ORAL
  Filled 2015-11-01 (×7): qty 1

## 2015-11-01 MED ORDER — POTASSIUM CHLORIDE CRYS ER 20 MEQ PO TBCR
40.0000 meq | EXTENDED_RELEASE_TABLET | Freq: Once | ORAL | Status: AC
  Administered 2015-11-01: 40 meq via ORAL
  Filled 2015-11-01: qty 2

## 2015-11-01 MED ORDER — OFLOXACIN 0.3 % OP SOLN
1.0000 [drp] | Freq: Four times a day (QID) | OPHTHALMIC | Status: AC
  Administered 2015-11-01 – 2015-11-05 (×20): 1 [drp] via OPHTHALMIC
  Filled 2015-11-01: qty 5

## 2015-11-01 MED ORDER — WARFARIN SODIUM 2.5 MG PO TABS
2.5000 mg | ORAL_TABLET | Freq: Once | ORAL | Status: AC
  Administered 2015-11-01: 2.5 mg via ORAL
  Filled 2015-11-01: qty 1

## 2015-11-01 NOTE — Progress Notes (Signed)
[facility] Progress Note  Patient: Ryan Barker ION:[id number]   PCP: Ryan Lance, MD DOB: May 15, 1946   DOA: 10/30/2015   DOS: 11/01/2015   Date of Service: the patient was seen and examined on 11/01/2015  Subjective: The patient today tells me that he continues to hear voices telling him that he is going to be discharged, he also tells me that his wife is next-door admitted in the hospital, he is able to tell me that he is in [facility] admitted in the ICU, does not have any acute complaint at present Nutrition: Tolerating oral diet Activity: Bedridden at present Last BM: 11/01/2015  Assessment and Plan: 1. Atrial fibrillation with rapid ventricular response [facility]) The patient is presenting with complains of hallucination. He is found to have A. fib with RVR. Known prior history of A. fib with chronic anticoagulation. Echocardiogram currently pending Free T4 elevated 1.25, we start the patient on methimazole Heart rate is well controlled so Cardizem will be continued in oral form. We'll start with 60 mg to 6 hours. Use IV Lopressor every 6 hours as needed for heart rate more than 120 despite Cardizem drip. Continue Lopressor 25 mg twice a day. Continue monitoring the step down unit at present.  2. Acute encephalopathy with psychosis and hallucinations. Suspected benzodiazepine withdrawal Suspected serotonin syndrome  The patient is having significant auditory hallucination. Differential diagnoses include benzodiazepine withdrawal since the patient has been on Xanax 1 mg every 8 hours as needed for more than 20 years, and recently has been tapered off due to memory issue. Patient was also recently started on Celexa, one week ago, and his presentation can also be explained by serotonin syndrome. Regardless of treatment is similar. We will continue with IV fluids, use scheduled Valium every 12 hours. Lorazepam every 4 hours as needed for increased tremors and  agitation. Discontinue Celexa. Once the patient is hemodynamically stable we will discuss with psychiatry regarding further medical management. CT head is unremarkable.  3. Dementia with depression. Currently holding Celexa. Continue benzodiazepine.  4. Hypokalemia, hypomagnesemia, lactic acidosis. All more likely secondary to dehydration. Continue IV fluids and currently has been replaced continue to monitor.  5. essential hypertension. Currently holding lisinopril while the patient is receiving rate controlling medications.  6.dyslipidemia. Continuing statin.  7.GERD. On Protonix instead of omeprazole at home.  DVT Prophylaxis: on therapeutic anticoagulation. Nutrition: cardiac diet Advance goals of care discussion: full code  Brief Summary of Hospitalization:  HPI: As per the H and P dictated on admission, "Ryan Barker is a 70 y.o. male with a Past medical history as bellow who is brought from home via EMS to the emergency department due to a scrotal swelling and incontinence.   Per patient, he has a "hidden penis" and states that when he urinates his urine is staying in his scrotum instead flowing out, causing his scrotum to swell. The patient also has been hallucinating and stating that the police is near and that they are about to arrest him for insurance fraud and charge him with a felony. His wife states that he was recently tapered off very quickly from alprazolam, which he has been using for anxiety for the past 20 years. She is states that he has been increasingly confused and delusional. He also had not taken his metoprolol and Cardizem since yesterday and was found to be in atrial fibrillation with RVR.   Workup in the emergency department is significant for hypokalemia, hypomagnesemia, hypophosphatemia and mild lactic acidosis."  Daily update, Procedures: Echocardiogram pending Consultants: none Antibiotics: Anti-infectives    None      Family Communication: no  family was present at bedside, at the time of interview.  Disposition:  Barriers to safe discharge: Improvement in A. fib with RVR as well as mentation    Intake/Output Summary (Last 24 hours) at 11/01/15 1720 Last data filed at 11/01/15 1100  Gross per 24 hour  Intake 2297.92 ml  Output    250 ml  Net 2047.92 ml   Filed Weights   10/30/15 2200  Weight: 125.7 kg (277 lb 1.9 oz)    Objective: Physical Exam: Filed Vitals:   11/01/15 1000 11/01/15 1100 11/01/15 1200 11/01/15 1333  BP: 137/45   149/119  Pulse: 105 88    Temp:   98.1 F (36.7 C)   TempSrc:   Oral   Resp: 17 22    Height:      Weight:      SpO2: 97% 97%      General: Appear in mild distress, improvement in tremor as well as auditory hallucination no Rash; Oral Mucosa moist Cardiovascular: S1 and S2 Present, no Murmur, no JVD Respiratory: Bilateral Air entry present and Clear to Auscultation, no Crackles, no wheezes Abdomen: Bowel Sound present, Soft and no tenderness Extremities: no Pedal edema, no calf tenderness Neurology: Grossly no focal neuro deficit.  Data Reviewed: CBC:  Recent Labs Lab 10/30/15 0539 10/30/15 1109 10/31/15 0134 11/01/15 0320  WBC 8.8  --  10.0 6.2  NEUTROABS 6.8  --  6.8  --   HGB 15.6 16.7 14.7 13.8  HCT 45.7 49.0 43.0 41.8  MCV 97.4  --  99.5 101.5*  PLT 253  --  251 207   Basic Metabolic Panel:  Recent Labs Lab 10/30/15 0539 10/30/15 1109 10/30/15 2036 10/31/15 0134 10/31/15 1712 11/01/15 0320  NA 135 138 138 139 142 140  K 2.9* 3.3* 3.6 3.7 3.8 3.4*  CL 96* 95* 100* 100* 101 100*  CO2 26  --  GLUCOSE 130* 118* 148* 118* 126* 111*  BUN 21* 18  CREATININE 0.97 0.70 0.83 0.81 1.22 1.01  CALCIUM 9.0  --  8.6* 8.9 9.0 8.5*  MG 1.3*  --   --   --  1.7 1.6*  PHOS 2.0*  --   --   --   --   --    Liver Function Tests:  Recent Labs Lab 10/30/15 0539 10/31/15 0134  AST 26 23  ALT 11* 12*  ALKPHOS 93 83  BILITOT 0.8 1.1  PROT 7.5  6.9  ALBUMIN 4.5 4.2   No results for input(s): LIPASE, AMYLASE in the last 168 hours. No results for input(s): AMMONIA in the last 168 hours.  Cardiac Enzymes:  Recent Labs Lab 10/30/15 2036 10/31/15 0131  TROPONINI 0.06* 0.06*    BNP (last 3 results) No results for input(s): BNP in the last 8760 hours.  CBG: No results for input(s): GLUCAP in the last 168 hours.  Recent Results (from the past 240 hour(s))  Urine culture     Status: None   Collection Time: 10/23/15 10:42 PM  Result Value Ref Range Status   Specimen Description URINE, RANDOM  Final   Special Requests NONE  Final   Culture   Final    MULTIPLE SPECIES PRESENT, SUGGEST RECOLLECTION Performed at [facility]    Report Status 10/25/2015 FINAL  Final  Urine culture  Status: None   Collection Time: 10/30/15  5:48 AM  Result Value Ref Range Status   Specimen Description URINE, CLEAN CATCH  Final   Special Requests NONE  Final   Culture   Final    MULTIPLE SPECIES PRESENT, SUGGEST RECOLLECTION Performed at [facility]    Report Status 10/31/2015 FINAL  Final  MRSA PCR Screening     Status: None   Collection Time: 10/30/15  9:13 PM  Result Value Ref Range Status   MRSA by PCR NEGATIVE NEGATIVE Final    Comment:        The GeneXpert MRSA Assay (FDA approved for NASAL specimens only), is one component of a comprehensive MRSA colonization surveillance program. It is not intended to diagnose MRSA infection nor to guide or monitor treatment for MRSA infections.      Studies: Ct Head Wo Contrast  11/01/2015  CLINICAL DATA:  Initial evaluation for acute encephalopathy, hallucinations. EXAM: CT HEAD WITHOUT CONTRAST TECHNIQUE: Contiguous axial images were obtained from the base of the skull through the vertex without intravenous contrast. COMPARISON:  None. FINDINGS: Study degraded by motion artifact. Diffuse prominence of the CSF containing spaces compatible with generalized cerebral  atrophy. Mild chronic small vessel ischemic type changes present within the periventricular deep white matter both cerebral hemispheres. No acute large vessel territory infarct. No acute intracranial hemorrhage. No mass lesion, midline shift, or mass effect. No hydrocephalus. No extra-axial fluid collection. Scalp soft tissues within normal limits. No acute abnormality about the orbits. Sequela prior lens extraction noted on the right. Paranasal sinuses are largely clear.  No mastoid effusion. Calvarium intact. IMPRESSION: 1. No acute intracranial process. 2. 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 Electronically Signed   By: Rise Mu M.D.   On: 11/01/2015 05:19     Scheduled Meds: . acetaminophen  650 mg Oral 3 times per day  . allopurinol  300 mg Oral Daily  . diazepam  5 mg Oral BID  . diltiazem  60 mg Oral 4 times per day  . feeding supplement (ENSURE ENLIVE)  237 mL Oral BID BM  . LORazepam  1 mg Intravenous Once  . metoprolol tartrate  25 mg Oral 3 times per day  . nystatin   Topical TID  . ofloxacin  1 drop Left Eye QID  . pantoprazole  40 mg Oral Daily  . pravastatin  40 mg Oral QHS  . warfarin  2.5 mg Oral ONCE-1800  . Warfarin - Pharmacist Dosing Inpatient   Does not apply q1800   Continuous Infusions: . sodium chloride Stopped (11/01/15 1327)   PRN Meds: LORazepam, metoprolol, ondansetron (ZOFRAN) IV  Time spent: 30 minutes  Author: Lynden Oxford, MD [facility] Pager: [phone] 11/01/2015 5:20 PM  If 7PM-7AM, please contact night-coverage at [email or web address], password [facility]

## 2015-11-01 NOTE — Progress Notes (Signed)
ANTICOAGULATION CONSULT NOTE - Follow up  Pharmacy Consult for Warfarin Indication: atrial fibrillation  No Known Allergies  Patient Measurements: Height:  (175.3 cm) Weight: 277 lb 1.9 oz (125.7 kg) IBW/kg (Calculated) : 70.7  Vital Signs: Temp: 97.8 F (36.6 C) (02/21 0800) Temp Source: Oral (02/21 0800) BP: 134/77 mmHg (02/21 0600) Pulse Rate: 86 (02/21 0630)  Labs:  Recent Labs  10/30/15 0539 10/30/15 1109 10/30/15 2036 10/31/15 0131 10/31/15 0134 10/31/15 1712 11/01/15 0320  HGB 15.6 16.7  --   --  14.7  --  13.8  HCT 45.7 49.0  --   --  43.0  --  41.8  PLT 253  --   --   --  251  --  207  LABPROT 20.9*  --   --   --  20.3*  --  24.4*  INR 1.80*  --   --   --  1.74*  --  2.22*  CREATININE 0.97 0.70 0.83  --  0.81 1.22 1.01  TROPONINI  --   --  0.06* 0.06*  --   --   --     Estimated Creatinine Clearance: 88 mL/min (by C-G formula based on Cr of 1.01).  Medications:  Scheduled:  . acetaminophen  650 mg Oral 3 times per day  . allopurinol  300 mg Oral Daily  . diazepam  5 mg Oral BID  . feeding supplement (ENSURE ENLIVE)  237 mL Oral BID BM  . LORazepam  1 mg Intravenous Once  . metoprolol tartrate  25 mg Oral 3 times per day  . nystatin   Topical TID  . ofloxacin  1 drop Left Eye QID  . pantoprazole  40 mg Oral Daily  . pravastatin  40 mg Oral QHS  . Warfarin - Pharmacist Dosing Inpatient   Does not apply q1800   Infusions:  . sodium chloride 125 mL/hr at 11/01/15 0354  . diltiazem (CARDIZEM) infusion 10 mg/hr (11/01/15 0500)    Assessment: 69 yoM presented to [facility] ED on 2/19 with scrotal swelling, anxiety.  His PMH includes atrial fibrillation on chronic warfarin anticoagulation.  He is noted to have Afib with RVR.  Pharmacy is consulted to continue warfarin dosing inpatient.  Home warfarin dose is reported as 2.5 mg daily except  on Tuesday/Fridays.  Last dose was 2/18.  Admission INR is 1.8.  Today, 11/01/2015:  INR now rising and  therapeutic after boosted doses.   CBC: Hgb and Plt WNL  Diet: heart healthy  No major drug-drug interactions noted  Goal of Therapy:  INR 2-3 Monitor platelets by anticoagulation protocol: Yes   Plan:   Warfarin 2.5mg  PO today   Daily INR and CBC  Loralee Pacas, PharmD, BCPS Pager: [phone] 11/01/2015,9:13 AM.

## 2015-11-01 NOTE — Progress Notes (Signed)
  Echocardiogram 2D Echocardiogram has been performed.  Ryan Barker 11/01/2015, 4:17 PM

## 2015-11-02 LAB — PROTIME-INR
INR: 3.06 — ABNORMAL HIGH (ref 0.00–1.49)
PROTHROMBIN TIME: 30.1 s — AB (ref 11.6–15.2)

## 2015-11-02 LAB — BASIC METABOLIC PANEL
Anion gap: 10 (ref 5–15)
BUN: 20 mg/dL (ref 6–20)
CHLORIDE: 101 mmol/L (ref 101–111)
CO2: 28 mmol/L (ref 22–32)
Calcium: 8.5 mg/dL — ABNORMAL LOW (ref 8.9–10.3)
Creatinine, Ser: 0.89 mg/dL (ref 0.61–1.24)
GFR calc non Af Amer: >60 mL/min (ref >60)
Glucose, Bld: 104 mg/dL — ABNORMAL HIGH (ref 65–99)
POTASSIUM: 3.8 mmol/L (ref 3.5–5.1)
SODIUM: 139 mmol/L (ref 135–145)

## 2015-11-02 LAB — MAGNESIUM: MAGNESIUM: 1.8 mg/dL (ref 1.7–2.4)

## 2015-11-02 LAB — CBC
HCT: 41.7 % (ref 39.0–52.0)
HEMOGLOBIN: 13.6 g/dL (ref 13.0–17.0)
MCH: 33.3 pg (ref 26.0–34.0)
MCHC: 32.6 g/dL (ref 30.0–36.0)
MCV: 102 fL — ABNORMAL HIGH (ref 78.0–100.0)
Platelets: 191 10*3/uL (ref 150–400)
RBC: 4.09 MIL/uL — AB (ref 4.22–5.81)
RDW: 15.5 % (ref 11.5–15.5)
WBC: 6.4 10*3/uL (ref 4.0–10.5)

## 2015-11-02 NOTE — Progress Notes (Signed)
[facility] Progress Note  Patient: Ryan Barker ZOX:[id number]   PCP:LOVEL SUAZOe, MD DOB: [birth year]   DOA: 10/30/2015   DOS: 11/02/2015     As per the H and P dictated on admission, "Ryan Barker is a 70 y.o. male with a Past medical history as bellow who is brought from home via EMS to the emergency department due to a scrotal swelling and incontinence.   Per patient, he has a "hidden penis" and states that when he urinates his urine is staying in his scrotum instead flowing out, causing his scrotum to swell. The patient also has been hallucinating and stating that the police is near and that they are about to arrest him for insurance fraud and charge him with a felony. His wife states that he was recently tapered off very quickly from alprazolam, which he has been using for anxiety for the past 20 years. She is states that he has been increasingly confused and delusional. He also had not taken his metoprolol and Cardizem since yesterday and was found to be in atrial fibrillation with RVR.   Workup in the emergency department is significant for hypokalemia, hypomagnesemia, hypophosphatemia and mild lactic acidosis."   Subjective: confused, seeing guns on the ceiling  Assessment and Plan: 1. Atrial fibrillation with rapid ventricular response [facility]) The patient is presenting with complains of hallucination. He is found to have A. fib with RVR. Known prior history of A. fib with chronic anticoagulation. Echocardiogram done TSH outpatient Cardizem Continue Lopressor 25 mg twice a day. Continue monitoring the step down unit at present.  2. Acute encephalopathy with psychosis and hallucinations. Suspected benzodiazepine withdrawal Suspected serotonin syndrome The patient is having significant visual/auditory hallucination.  -Differential diagnoses include benzodiazepine withdrawal since the patient has been on Xanax 1 mg every 8 hours as needed for more than 20 years, and  recently has been tapered off due to memory issue. Patient was also recently started on Celexa, one week ago, and his presentation can also be explained by serotonin syndrome. Regardless of treatment is similar. We will continue with IV fluids, use scheduled Valium every 12 hours. Lorazepam every 4 hours as needed for increased tremors and agitation. Discontinue Celexa. Psych consult-- ? Need to [facility]?  3. Dementia with depression. Currently holding Celexa. Continue benzodiazepine.  4. Hypokalemia, hypomagnesemia, lactic acidosis. All more likely secondary to dehydration. Continue IV fluids and currently has been replaced continue to monitor.  5. essential hypertension. Currently holding lisinopril while the patient is receiving rate controlling medications.  6.dyslipidemia. Continuing statin.  7.GERD. On Protonix instead of omeprazole at home.  DVT Prophylaxis: on therapeutic anticoagulation. Nutrition: cardiac diet Advance goals of care discussion: full code   Consultants: psych  Antibiotics: Anti-infectives    None      Family Communication: no family was present at bedside, at the time of interview.     Intake/Output Summary (Last 24 hours) at 11/02/15 1517 Last data filed at 11/02/15 1215  Gross per 24 hour  Intake   3150 ml  Output   1150 ml  Net   2000 ml   Filed Weights   10/30/15 2200  Weight: 125.7 kg (277 lb 1.9 oz)    Objective: Physical Exam: Filed Vitals:   11/02/15 1005 11/02/15 1157 11/02/15 1200 11/02/15 1430  BP: 141/83  130/53 120/64  Pulse: 90  95 82  Temp:  97.9 F (36.6 C)    TempSrc:  Oral    Resp: 22  19  18  Height:      Weight:      SpO2: 96%  97% 97%    General: Appear in mild distress, improvement in tremor as well as auditory hallucination no Rash; Oral Mucosa moist Cardiovascular: S1 and S2 Present, no Murmur, no JVD Respiratory: Bilateral Air entry present and Clear to Auscultation, no Crackles, no  wheezes Abdomen: Bowel Sound present, Soft and no tenderness Extremities: no Pedal edema, no calf tenderness   Data Reviewed: CBC:  Recent Labs Lab 10/30/15 0539 10/30/15 1109 10/31/15 0134 11/01/15 0320 11/02/15 0350  WBC 8.8  --  10.0 6.2 6.4  NEUTROABS 6.8  --  6.8  --   --   HGB 15.6 16.7 14.7 13.8 13.6  HCT 45.7 49.0 43.0 41.8 41.7  MCV 97.4  --  99.5 101.5* 102.0*  PLT 253  --  251 207 191   Basic Metabolic Panel:  Recent Labs Lab 10/30/15 0539  10/30/15 2036 10/31/15 0134 10/31/15 1712 11/01/15 0320 11/02/15 0350  NA 135  < > 138 139 142 140 139  K 2.9*  < > 3.6 3.7 3.8 3.4* 3.8  CL 96*  < > 100* 100* 101 100* 101  CO2 26  --  25 28 27 30 28   GLUCOSE 130*  < > 148* 118* 126* 111* 104*  BUN 16  < > 11 13 21* 18 20  CREATININE 0.97  < > 0.83 0.81 1.22 1.01 0.89  CALCIUM 9.0  --  8.6* 8.9 9.0 8.5* 8.5*  MG 1.3*  --   --   --  1.7 1.6* 1.8  PHOS 2.0*  --   --   --   --   --   --   < > = values in this interval not displayed. Liver Function Tests:  Recent Labs Lab 10/30/15 0539 10/31/15 0134  AST 26 23  ALT 11* 12*  ALKPHOS 93 83  BILITOT 0.8 1.1  PROT 7.5 6.9  ALBUMIN 4.5 4.2   No results for input(s): LIPASE, AMYLASE in the last 168 hours. No results for input(s): AMMONIA in the last 168 hours.  Cardiac Enzymes:  Recent Labs Lab 10/30/15 2036 10/31/15 0131  TROPONINI 0.06* 0.06*    BNP (last 3 results) No results for input(s): BNP in the last 8760 hours.  CBG: No results for input(s): GLUCAP in the last 168 hours.  Recent Results (from the past 240 hour(s))  Urine culture     Status: None   Collection Time: 10/23/15 10:42 PM  Result Value Ref Range Status   Specimen Description URINE, RANDOM  Final   Special Requests NONE  Final   Culture   Final    MULTIPLE SPECIES PRESENT, SUGGEST RECOLLECTION Performed at [facility]    Report Status 10/25/2015 FINAL  Final  Urine culture     Status: None   Collection Time: 10/30/15   5:48 AM  Result Value Ref Range Status   Specimen Description URINE, CLEAN CATCH  Final   Special Requests NONE  Final   Culture   Final    MULTIPLE SPECIES PRESENT, SUGGEST RECOLLECTION Performed at [facility]    Report Status 10/31/2015 FINAL  Final  MRSA PCR Screening     Status: None   Collection Time: 10/30/15  9:13 PM  Result Value Ref Range Status   MRSA by PCR NEGATIVE NEGATIVE Final    Comment:        The GeneXpert MRSA Assay (FDA approved for  NASAL specimens only), is one component of a comprehensive MRSA colonization surveillance program. It is not intended to diagnose MRSA infection nor to guide or monitor treatment for MRSA infections.      Studies: No results found.   Scheduled Meds: . acetaminophen  650 mg Oral 3 times per day  . allopurinol  300 mg Oral Daily  . diazepam  5 mg Oral BID  . diltiazem  60 mg Oral 4 times per day  . feeding supplement (ENSURE ENLIVE)  237 mL Oral BID BM  . LORazepam  1 mg Intravenous Once  . metoprolol tartrate  25 mg Oral 3 times per day  . nystatin   Topical TID  . ofloxacin  1 drop Left Eye QID  . pantoprazole  40 mg Oral Daily  . pravastatin  40 mg Oral QHS  . Warfarin - Pharmacist Dosing Inpatient   Does not apply q1800   Continuous Infusions:   PRN Meds: LORazepam, metoprolol, ondansetron (ZOFRAN) IV  Time spent: 35 minutes  Marlin Canary DO [phone]   11/02/2015 3:17 PM  If 7PM-7AM, please contact night-coverage at [email or web address], password [facility]

## 2015-11-02 NOTE — Consult Note (Signed)
[facility] Face-to-Face Psychiatry Consult   Reason for Consult:  Depression, change in cognitive states secondary to medication changes Referring Physician:  Dr. Vann Patient Identification: Ryan Barker MRN:  [id number] Principal Diagnosis: Atrial fibrillation with rapid ventricular response (HCC) Diagnosis:   Patient Active Problem List   Diagnosis Date Noted  . 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 GERD (gastroesophageal reflux disease) [K21.9] 10/31/2015  . Atrial fibrillation with rapid ventricular response (HCC) [I48.91] 10/30/2015  . 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 Hypokalemia [E87.6] 10/30/2015  . Atrial fibrillation with RVR (HCC) [I48.91] 10/30/2015  . Hypomagnesemia [E83.42] 10/30/2015  . Dementia with behavioral disturbance [F03.91] 10/24/2015  . 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 Alteration in self-care ability [R53.81]   . Chronic atrial fibrillation (HCC) [I48.2] 06/24/2013    Class: Chronic  . Long term current use of anticoagulant therapy [Z79.01] 06/24/2013  . Other and unspecified hyperlipidemia [E78.5] 06/24/2013  . Essential hypertension, benign [I10] 06/24/2013    Total Time spent with patient: 45 minutes  Subjective:   Ryan Barker is a 69 y.o. male patient medication changed and will await geropsychiatry unless he improves.  HPI: Ryan Barker is a 70 y.o. male seen, chart reviewed for face-to-face psychiatric consultation and evaluation of increased symptoms of depression, anxiety, short-term memory loss and reportedly sweating and being himself along with difficulty falling asleep. Patient reported he came to the hospital a few days ago secondary to increased irritability, agitation and not like himself. At that time his medication was changed and added Celexa. Now patient Celexa was discontinued and placed on Valium 5 mg twice daily because patient might be suffering with the  benzodiazepine withdrawal Jude to taking Xanax 3 times daily over 20-30 years for unknown anxiety. Patient reported he is feeling better today and has no significant panic episodes but has mild anxiety and fine tremors on his right hand. Patient wife is at bedside and able to contribute for this history. 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 Patient has been receiving Xanax from primary care physician and recently diagnosed with atrial fibrillation with rapid ventricular response which required inpatient hospitalization. Patient stated he imagined upper of his friends are in his room last night but not this morning her this afternoon.  Past Psychiatric History: Patient Has No past History of Acute Psychiatric Hospitalization   Risk to Self: Suicidal Ideation: No Suicidal Intent: No Is patient at risk for suicide?: No Suicidal Plan?: No Access to Means: No What has been your use of drugs/alcohol within the last 12 months?: denies How many times?: 0 Other Self Harm Risks: Denies Triggers for Past Attempts: None known Intentional Self Injurious Behavior: None Risk to Others: Homicidal Ideation: No Thoughts of Harm to Others: No Current Homicidal Intent: No Current Homicidal Plan: No Access to Homicidal Means: No Identified Victim: Denies History of harm to others?: No Assessment of Violence: None Noted Violent Behavior Description: Denies Does patient have access to weapons?: Yes (Comment) (locked in a safe, he does not have access to safe) Criminal Charges Pending?: No Does patient have a court date: No Prior Inpatient Therapy: Prior Inpatient Therapy: No Prior Therapy Dates: N/A Prior Therapy Facilty/Provider(s): N/A Reason for Treatment: N/A Prior Outpatient Therapy: Prior Outpatient Therapy: No Prior Therapy Dates: N/A Prior Therapy Facilty/Provider(s):  N/A Reason for Treatment: N/A Does   patient have an ACCT team?: No Does patient have Intensive In-House Services?  : No Does patient have [facility] services? : No Does patient have P4CC services?: No  Past Medical History:  Past Medical History  Diagnosis Date  . Gout   . HTN (hypertension)   . Depressive disorder   . Atrial fibrillation (HCC)   . Osteoarthritis   . Obesity   . GERD (gastroesophageal reflux disease)   . Hypercholesteremia   . Carpal tunnel syndrome   . Legally blind in left eye, as defined in [place]   . Hypercholesteremia   . 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  They did surgery on that 15 yrs ago."    Past Surgical History  Procedure Laterality Date  . Tonsillectomy    . Inguinal hernia repair     Family History: History reviewed. No pertinent family history. Family Psychiatric  History: Noe Social History:  History  Alcohol Use No     History  Drug Use No    Social History   Social History  . Marital Status: Married    Spouse Name: N/A  . Number of Children: N/A  . Years of Education: N/A   Social History Main Topics  . Smoking status: Never Smoker   . Smokeless tobacco: None  . Alcohol Use: No  . Drug Use: No  . Sexual Activity: Not Asked   Other Topics Concern  . None   Social History Narrative   Additional Social History:    Allergies:  No Known Allergies  Labs:  Results for orders placed or performed during the hospital encounter of 10/30/15 (from the past 48 hour(s))  Magnesium     Status: None   Collection Time: 10/31/15  5:12 PM  Result Value Ref Range   Magnesium 1.7 1.7 - 2.4 mg/dL  Basic metabolic panel     Status: Abnormal   Collection Time: 10/31/15  5:12 PM  Result Value Ref Range   Sodium 142 135 - 145 mmol/L   Potassium 3.8 3.5 - 5.1 mmol/L   Chloride 101 101 - 111 mmol/L   CO2 27 22 - 32 mmol/L   Glucose, Bld 126 (H) 65 - 99 mg/dL   BUN 21 (H) 6 - 20 mg/dL   Creatinine, Ser  1.22 0.61 - 1.24 mg/dL   Calcium 9.0 8.9 - 10.3 mg/dL   GFR calc non Af Amer 58 (L) >60 mL/min   GFR calc Af Amer >60 >60 mL/min    Comment: (NOTE) The eGFR has been calculated using the CKD EPI equation. This calculation has not been validated in all clinical situations. eGFR's persistently <60 mL/min signify possible Chronic Kidney Disease.    Anion gap 14 5 - 15  TSH     Status: None   Collection Time: 10/31/15  5:15 PM  Result Value Ref Range   TSH 1.259 0.350 - 4.500 uIU/mL  T4, free     Status: Abnormal   Collection Time: 10/31/15  5:15 PM  Result Value Ref Range   Free T4 1.25 (H) 0.61 - 1.12 ng/dL    Comment: Performed at [facility]  Protime-INR     Status: Abnormal   Collection Time: 11/01/15  3:20 AM  Result Value Ref Range   Prothrombin Time 24.4 (H) 11.6 - 15.2 seconds   INR 2.22 (H) 0.00 - 1.49  CBC     Status: Abnormal   Collection Time: 11/01/15    3:20 AM  Result Value Ref Range   WBC 6.2 4.0 - 10.5 K/uL   RBC 4.12 (L) 4.22 - 5.81 MIL/uL   Hemoglobin 13.8 13.0 - 17.0 g/dL   HCT 41.8 39.0 - 52.0 %   MCV 101.5 (H) 78.0 - 100.0 fL   MCH 33.5 26.0 - 34.0 pg   MCHC 33.0 30.0 - 36.0 g/dL   RDW 15.5 11.5 - 15.5 %   Platelets 207 150 - 400 K/uL  Basic metabolic panel     Status: Abnormal   Collection Time: 11/01/15  3:20 AM  Result Value Ref Range   Sodium 140 135 - 145 mmol/L   Potassium 3.4 (L) 3.5 - 5.1 mmol/L   Chloride 100 (L) 101 - 111 mmol/L   CO2 30 22 - 32 mmol/L   Glucose, Bld 111 (H) 65 - 99 mg/dL   BUN 18 6 - 20 mg/dL   Creatinine, Ser 1.01 0.61 - 1.24 mg/dL   Calcium 8.5 (L) 8.9 - 10.3 mg/dL   GFR calc non Af Amer >60 >60 mL/min   GFR calc Af Amer >60 >60 mL/min    Comment: (NOTE) The eGFR has been calculated using the CKD EPI equation. This calculation has not been validated in all clinical situations. eGFR's persistently <60 mL/min signify possible Chronic Kidney Disease.    Anion gap 10 5 - 15  Magnesium     Status: Abnormal    Collection Time: 11/01/15  3:20 AM  Result Value Ref Range   Magnesium 1.6 (L) 1.7 - 2.4 mg/dL  Protime-INR     Status: Abnormal   Collection Time: 11/02/15  3:50 AM  Result Value Ref Range   Prothrombin Time 30.1 (H) 11.6 - 15.2 seconds   INR 3.06 (H) 0.00 - 1.49  CBC     Status: Abnormal   Collection Time: 11/02/15  3:50 AM  Result Value Ref Range   WBC 6.4 4.0 - 10.5 K/uL   RBC 4.09 (L) 4.22 - 5.81 MIL/uL   Hemoglobin 13.6 13.0 - 17.0 g/dL   HCT 41.7 39.0 - 52.0 %   MCV 102.0 (H) 78.0 - 100.0 fL   MCH 33.3 26.0 - 34.0 pg   MCHC 32.6 30.0 - 36.0 g/dL   RDW 15.5 11.5 - 15.5 %   Platelets 191 150 - 400 K/uL  Basic metabolic panel     Status: Abnormal   Collection Time: 11/02/15  3:50 AM  Result Value Ref Range   Sodium 139 135 - 145 mmol/L   Potassium 3.8 3.5 - 5.1 mmol/L   Chloride 101 101 - 111 mmol/L   CO2 28 22 - 32 mmol/L   Glucose, Bld 104 (H) 65 - 99 mg/dL   BUN 20 6 - 20 mg/dL   Creatinine, Ser 0.89 0.61 - 1.24 mg/dL   Calcium 8.5 (L) 8.9 - 10.3 mg/dL   GFR calc non Af Amer >60 >60 mL/min   GFR calc Af Amer >60 >60 mL/min    Comment: (NOTE) The eGFR has been calculated using the CKD EPI equation. This calculation has not been validated in all clinical situations. eGFR's persistently <60 mL/min signify possible Chronic Kidney Disease.    Anion gap 10 5 - 15  Magnesium     Status: None   Collection Time: 11/02/15  3:50 AM  Result Value Ref Range   Magnesium 1.8 1.7 - 2.4 mg/dL    Current Facility-Administered Medications  Medication Dose Route Frequency Provider Last Rate Last Dose  .  acetaminophen (TYLENOL) tablet 650 mg  650 mg Oral 3 times per day Reubin Milan, MD   650 mg at 11/02/15 0521  . allopurinol (ZYLOPRIM) tablet 300 mg  300 mg Oral Daily Reubin Milan, MD   300 mg at 11/02/15 1002  . diazepam (VALIUM) tablet 5 mg  5 mg Oral BID Lavina Hamman, MD   5 mg at 11/02/15 1002  . diltiazem (CARDIZEM) tablet 60 mg  60 mg Oral 4 times per day  Lavina Hamman, MD   60 mg at 11/02/15 8325  . feeding supplement (ENSURE ENLIVE) (ENSURE ENLIVE) liquid 237 mL  237 mL Oral BID BM Reubin Milan, MD   237 mL at 11/02/15 1002  . LORazepam (ATIVAN) injection 1 mg  1 mg Intravenous Once Lavina Hamman, MD      . LORazepam (ATIVAN) injection 2-3 mg  2-3 mg Intravenous Q1H PRN Lavina Hamman, MD   2 mg at 11/01/15 1900  . metoprolol (LOPRESSOR) injection 2.5 mg  2.5 mg Intravenous Q4H PRN Lavina Hamman, MD      . metoprolol tartrate (LOPRESSOR) tablet 25 mg  25 mg Oral 3 times per day Lavina Hamman, MD   25 mg at 11/02/15 0710  . nystatin (MYCOSTATIN/NYSTOP) topical powder   Topical TID Reubin Milan, MD      . ofloxacin (OCUFLOX) 0.3 % ophthalmic solution 1 drop  1 drop Left Eye QID Lavina Hamman, MD   1 drop at 11/02/15 1003  . ondansetron (ZOFRAN) injection 4 mg  4 mg Intravenous Q6H PRN Reubin Milan, MD      . pantoprazole (PROTONIX) EC tablet 40 mg  40 mg Oral Daily Reubin Milan, MD   40 mg at 11/02/15 1002  . pravastatin (PRAVACHOL) tablet 40 mg  40 mg Oral QHS Reubin Milan, MD   40 mg at 11/01/15 2131  . Warfarin - Pharmacist Dosing Inpatient   Does not apply q1800 Randa Spike, RPH   0  at 10/31/15 1400    Musculoskeletal: Strength & Muscle Tone: decreased Gait & Station: unsteady Patient leans: N/A  Psychiatric Specialty Exam: Review of Systems  Constitutional: Negative.   HENT: Negative.   Eyes: Negative.   Respiratory: Negative.   Cardiovascular: Negative.   Gastrointestinal: Negative.   Genitourinary: Negative.   Musculoskeletal: Negative.   Skin: Negative.   Neurological: Negative.   Endo/Heme/Allergies: Negative.   Psychiatric/Behavioral: Positive for depression, suicidal ideas and memory loss. The patient is nervous/anxious.     Blood pressure 141/83, pulse 90, temperature 97 F (36.1 C), temperature source Axillary, resp. rate 22, height 5' 9" (1.753 m), weight 125.7 kg (277 lb 1.9  oz), SpO2 96 %.Body mass index is 40.9 kg/(m^2).  General Appearance: Disheveled  Eye Contact::  Good  Speech:  Normal Rate  Volume:  Normal  Mood:  Irritable  Affect:  Congruent  Thought Process:  Coherent  Orientation:  Full (Time, Place, and Person)  Thought Content:  Rumination  Suicidal Thoughts:  No  Homicidal Thoughts:  No  Memory:  Immediate;   Fair Recent;   Poor Remote;   Good  Judgement:  Impaired  Insight:  Lacking  Psychomotor Activity:  Decreased  Concentration:  Fair  Recall:  [place]: Fair  Akathisia:  No  Handed:  Right  AIMS (if indicated):     Assets:  Housing Leisure Time Resilience Social Support  ADL's:  Impaired  Cognition: Impaired,  Moderate  Sleep:      Treatment Plan Summary: Daily contact with patient to assess and evaluate symptoms and progress in treatment, Medication management and Plan dementia with behavioral disturbances:  Agrees with Valium 5 mg twice daily to prevent benzodiazepine withdrawal  Ativan detox protocol for benzodiazepine withdrawal  Agrees with the discontinuation of Celexa which is not helpful for anxiety. Appreciate psychiatric consultation and follow up as clinically required Please contact [phone] or [phone] if needs further assistance  Disposition: Patient does not meet criteria for psychiatric inpatient admission. Supportive therapy provided about ongoing stressors.  ,JANARDHAHA R., MD 11/02/2015 10:29 AM  

## 2015-11-02 NOTE — Progress Notes (Addendum)
ANTICOAGULATION CONSULT NOTE - Follow up  Pharmacy Consult for Warfarin Indication: atrial fibrillation  No Known Allergies  Patient Measurements: Height:  (175.3 cm) Weight: 277 lb 1.9 oz (125.7 kg) IBW/kg (Calculated) : 70.7  Vital Signs: Temp: 97.9 F (36.6 C) (02/22 1157) Temp Source: Oral (02/22 1157) BP: 130/53 mmHg (02/22 1200) Pulse Rate: 95 (02/22 1200)  Labs:  Recent Labs  10/30/15 2036 10/31/15 0131  10/31/15 0134 10/31/15 1712 11/01/15 0320 11/02/15 0350  HGB  --   --   < > 14.7  --  13.8 13.6  HCT  --   --   --  43.0  --  41.8 41.7  PLT  --   --   --  251  --  207 191  LABPROT  --   --   --  20.3*  --  24.4* 30.1*  INR  --   --   --  1.74*  --  2.22* 3.06*  CREATININE 0.83  --   --  0.81 1.22 1.01 0.89  TROPONINI 0.06* 0.06*  --   --   --   --   --   < > = values in this interval not displayed.  Estimated Creatinine Clearance: 99.8 mL/min (by C-G formula based on Cr of 0.89).  Medications:  Scheduled:  . acetaminophen  650 mg Oral 3 times per day  . allopurinol  300 mg Oral Daily  . diazepam  5 mg Oral BID  . diltiazem  60 mg Oral 4 times per day  . feeding supplement (ENSURE ENLIVE)  237 mL Oral BID BM  . LORazepam  1 mg Intravenous Once  . metoprolol tartrate  25 mg Oral 3 times per day  . nystatin   Topical TID  . ofloxacin  1 drop Left Eye QID  . pantoprazole  40 mg Oral Daily  . pravastatin  40 mg Oral QHS  . Warfarin - Pharmacist Dosing Inpatient   Does not apply q1800   Infusions:     Assessment: Ryan Barker presented to [facility] ED on 2/19 with scrotal swelling, anxiety.  His PMH includes atrial fibrillation on chronic warfarin anticoagulation.  He is noted to have Afib with RVR.  Pharmacy is consulted to continue warfarin dosing inpatient.  Home warfarin dose is reported as 2.5 mg daily except  on Tuesday/Fridays.  Last dose was 2/18.  Admission INR is 1.8.  Today, 11/02/2015:  INR now rising and supratherapeutic after boosted doses.    CBC: stable WNL  Diet: heart healthy  No major drug-drug interactions noted  Goal of Therapy:  INR 2-3 Monitor platelets by anticoagulation protocol: Yes   Plan:   Hold warfarin today  Daily INR and CBC  Continue monitoring   Bernadene Person, PharmD, BCPS Pager: [phone] 11/02/2015, 2:03 PM

## 2015-11-03 LAB — CBC
HEMATOCRIT: 38.8 % — AB (ref 39.0–52.0)
Hemoglobin: 12.9 g/dL — ABNORMAL LOW (ref 13.0–17.0)
MCH: 33.5 pg (ref 26.0–34.0)
MCHC: 33.2 g/dL (ref 30.0–36.0)
MCV: 100.8 fL — AB (ref 78.0–100.0)
Platelets: 170 10*3/uL (ref 150–400)
RBC: 3.85 MIL/uL — AB (ref 4.22–5.81)
RDW: 15.2 % (ref 11.5–15.5)
WBC: 6.4 10*3/uL (ref 4.0–10.5)

## 2015-11-03 LAB — BASIC METABOLIC PANEL
ANION GAP: 8 (ref 5–15)
BUN: 22 mg/dL — ABNORMAL HIGH (ref 6–20)
CO2: 29 mmol/L (ref 22–32)
Calcium: 8.6 mg/dL — ABNORMAL LOW (ref 8.9–10.3)
Chloride: 103 mmol/L (ref 101–111)
Creatinine, Ser: 0.91 mg/dL (ref 0.61–1.24)
GFR calc Af Amer: >60 mL/min (ref >60)
GFR calc non Af Amer: >60 mL/min (ref >60)
Glucose, Bld: 99 mg/dL (ref 65–99)
POTASSIUM: 4.1 mmol/L (ref 3.5–5.1)
Sodium: 140 mmol/L (ref 135–145)

## 2015-11-03 LAB — PROTIME-INR
INR: 3.24 — ABNORMAL HIGH (ref 0.00–1.49)
Prothrombin Time: 32.4 seconds — ABNORMAL HIGH (ref 11.6–15.2)

## 2015-11-03 MED ORDER — DILTIAZEM HCL 90 MG PO TABS
90.0000 mg | ORAL_TABLET | Freq: Four times a day (QID) | ORAL | Status: DC
  Administered 2015-11-03 – 2015-11-07 (×17): 90 mg via ORAL
  Filled 2015-11-03 (×17): qty 1

## 2015-11-03 MED ORDER — POLYETHYLENE GLYCOL 3350 17 G PO PACK
17.0000 g | PACK | Freq: Every day | ORAL | Status: DC
  Administered 2015-11-03 – 2015-11-07 (×4): 17 g via ORAL
  Filled 2015-11-03 (×5): qty 1

## 2015-11-03 MED ORDER — VITAMIN B-1 100 MG PO TABS
100.0000 mg | ORAL_TABLET | Freq: Every day | ORAL | Status: DC
  Administered 2015-11-03 – 2015-11-07 (×5): 100 mg via ORAL
  Filled 2015-11-03 (×5): qty 1

## 2015-11-03 MED ORDER — THIAMINE HCL 100 MG/ML IJ SOLN
100.0000 mg | Freq: Once | INTRAMUSCULAR | Status: DC
  Filled 2015-11-03: qty 1

## 2015-11-03 MED ORDER — VITAMIN B-1 100 MG PO TABS: 100.0000 mg | ORAL_TABLET | Freq: Every day | ORAL | Status: DC

## 2015-11-03 MED ORDER — LOPERAMIDE HCL 2 MG PO CAPS: 2.0000 mg | ORAL_CAPSULE | ORAL | Status: AC | PRN

## 2015-11-03 MED ORDER — HYDROXYZINE HCL 25 MG PO TABS
25.0000 mg | ORAL_TABLET | Freq: Four times a day (QID) | ORAL | Status: AC | PRN
  Administered 2015-11-04 (×2): 25 mg via ORAL
  Filled 2015-11-03 (×2): qty 1

## 2015-11-03 MED ORDER — LORAZEPAM 1 MG PO TABS
1.0000 mg | ORAL_TABLET | Freq: Four times a day (QID) | ORAL | Status: DC | PRN
  Administered 2015-11-03: 1 mg via ORAL
  Filled 2015-11-03: qty 1

## 2015-11-03 MED ORDER — ADULT MULTIVITAMIN W/MINERALS CH
1.0000 | ORAL_TABLET | Freq: Every day | ORAL | Status: DC
  Administered 2015-11-03 – 2015-11-07 (×5): 1 via ORAL
  Filled 2015-11-03 (×5): qty 1

## 2015-11-03 MED ORDER — ONDANSETRON 4 MG PO TBDP: 4.0000 mg | ORAL_TABLET | Freq: Four times a day (QID) | ORAL | Status: AC | PRN

## 2015-11-03 NOTE — Progress Notes (Signed)
ANTICOAGULATION CONSULT NOTE - Follow up  Pharmacy Consult for Warfarin Indication: atrial fibrillation  No Known Allergies  Patient Measurements: Height:  (175.3 cm) Weight: 287 lb 14.7 oz (130.6 kg) IBW/kg (Calculated) : 70.7  Vital Signs: Temp: 98.1 F (36.7 C) (02/23 0800) Temp Source: Oral (02/23 0800) BP: 178/88 mmHg (02/23 0600) Pulse Rate: 108 (02/23 0600)  Labs:  Recent Labs  11/01/15 0320 11/02/15 0350 11/03/15 0337  HGB 13.8 13.6 12.9*  HCT 41.8 41.7 38.8*  PLT 207 191 170  LABPROT 24.4* 30.1* 32.4*  INR 2.22* 3.06* 3.24*  CREATININE 1.01 0.89 0.91    Estimated Creatinine Clearance: 99.7 mL/min (by C-G formula based on Cr of 0.91).  Medications:  Scheduled:  . acetaminophen  650 mg Oral 3 times per day  . allopurinol  300 mg Oral Daily  . diazepam  5 mg Oral BID  . diltiazem  90 mg Oral 4 times per day  . feeding supplement (ENSURE ENLIVE)  237 mL Oral BID BM  . LORazepam  1 mg Intravenous Once  . metoprolol tartrate  25 mg Oral 3 times per day  . nystatin   Topical TID  . ofloxacin  1 drop Left Eye QID  . pantoprazole  40 mg Oral Daily  . pravastatin  40 mg Oral QHS  . Warfarin - Pharmacist Dosing Inpatient   Does not apply q1800   Infusions:     Assessment: 61 yoM presented to [facility] ED on 2/19 with scrotal swelling, anxiety.  His PMH includes atrial fibrillation on chronic warfarin anticoagulation.  He is noted to have Afib with RVR.  Pharmacy is consulted to continue warfarin dosing inpatient.  Home warfarin dose is reported as 2.5 mg daily except  on Tuesday/Fridays.  Last dose was 2/18.  Admission INR is 1.8.  Today, 11/03/2015:  INR supratherapeutic and continuing to rise after boosted doses.   CBC: Hgb/Plt lower but non-critical  Diet: heart healthy; eating 100% of meals  No major drug-drug interactions noted  Goal of Therapy:  INR 2-3 Monitor platelets by anticoagulation protocol: Yes   Plan:   Hold warfarin today; on  SCDs  Daily INR and CBC  Continue monitoring   Bernadene Person, PharmD, BCPS Pager: [phone] 11/03/2015, 11:15 AM
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
## 2015-11-03 NOTE — Progress Notes (Signed)
Date: November 03, 2015 Chart reviewed for concurrent status and case management needs. Will continue to follow patient for changes and needs:  Transferring to the telemetry floor from icu Marcelle Smiling, BSN, Charity fundraiser, [place]   [phone]

## 2015-11-03 NOTE — Evaluation (Addendum)
Physical Therapy Evaluation Patient Details Name: Ryan Barker MRN: [id number] DOB: September 29, 1945 Today's Date: 11/03/2015   History of Present Illness  Ryan Barker is a 70 y.o. male with a Past medical history as bellow who is brought from home via EMS to the emergency department 10/30/15 due to a scrotal swelling and incontinence. Workup in the emergency department is significant for hypokalemia, hypomagnesemia, hypophosphatemia and mild lactic acidosis. patient found to be in AFIB/RVR.  Was in ED 1 week ago and DC'd home at the time. Patient has been  hallucinating this admission.  Clinical Impression  Patient is pleasant, known to this Clinical research associate from last week. Patient able to get up with 2 assist to recliner with RW. Patient will benefit from PT to address problems listed in the note below to Dc to next visit.    Follow Up Recommendations SNF;Supervision/Assistance - 24 hour    Equipment Recommendations  Rolling walker with 5" wheels    Recommendations for Other Services       Precautions / Restrictions Precautions Precautions: Fall Precaution Comments: incontinence, both knees with DJD significant      Mobility  Bed Mobility Overal bed mobility: Needs Assistance Bed Mobility: Supine to Sit     Supine to sit: Mod assist;HOB elevated;+2 for safety/equipment     General bed mobility comments: assist with trunk to upright , extra time, able to weight shift and scoot to edge  Transfers Overall transfer level: Needs assistance Equipment used: Rolling walker (2 wheeled) Transfers: Sit to/from [employer] Sit to Stand: Mod assist;+2 physical assistance;+2 safety/equipment;From elevated surface Stand pivot transfers: Mod assist;+2 physical assistance;+2 safety/equipment       General transfer comment: assist to power up with bed raised, ashuffle steps to recliner. decreased control of descent.HR in 90's, sats 100% on RA  Ambulation/Gait              General Gait Details: NT  Stairs            Wheelchair Mobility    Modified Rankin (Stroke Patients Only)       Balance Overall balance assessment: Needs assistance Sitting-balance support: Feet supported;No upper extremity supported Sitting balance-Leahy Scale: Fair     Standing balance support: During functional activity;Bilateral upper extremity supported Standing balance-Leahy Scale: Poor                               Pertinent Vitals/Pain Pain Assessment: No/denies pain    Home Living Family/patient expects to be discharged to:: Private residence Living Arrangements: Spouse/significant other Available Help at Discharge: Family Type of Home: House Home Access: Stairs to enter Entrance Stairs-Rails: Right Entrance Stairs-Number of Steps: 4 Home Layout: One level Home Equipment: Walker - 4 wheels;Cane - single point;Bedside commode Additional Comments: is to get a 2 wheeled per wife    Prior Function Level of Independence: Needs assistance   Gait / Transfers Assistance Needed: minimal ambulation after DC last week.           Hand Dominance        Extremity/Trunk Assessment   Upper Extremity Assessment: RUE deficits/detail;LUE deficits/detail RUE Deficits / Details: decreased shoulder elevation,f , audible crepitus     LUE Deficits / Details: similar to R, crepitus   Lower Extremity Assessment: RLE deficits/detail;LLE deficits/detail RLE Deficits / Details: knee crepitus audible. able to stand  and bear weight LLE Deficits / Details: same as R  Communication      Cognition Arousal/Alertness: Awake/alert Behavior During Therapy: WFL for tasks assessed/performed Overall Cognitive Status: looked at calem=ndar, knows WL, states"I have been seeing things that aren't real"                      General Comments      Exercises        Assessment/Plan    PT Assessment Patient needs continued PT services  PT  Diagnosis Difficulty walking;Generalized weakness   PT Problem List Decreased strength;Decreased range of motion;Decreased activity tolerance;Decreased mobility;Decreased balance;Decreased knowledge of use of DME;Decreased safety awareness;Decreased knowledge of precautions  PT Treatment Interventions     PT Goals (Current goals can be found in the Care Plan section) Acute Rehab PT Goals Patient Stated Goal: whatever it takes PT Goal Formulation: With patient/family Time For Goal Achievement: 11/17/15 Potential to Achieve Goals: Good    Frequency Min 3X/week   Barriers to discharge Decreased caregiver support      Co-evaluation               End of Session Equipment Utilized During Treatment: Gait belt Activity Tolerance: Patient tolerated treatment well Patient left: in chair;with call bell/phone within reach;with chair alarm set;with family/visitor present Nurse Communication: Mobility status         Time: 0251-0313 PT Time Calculation (min) (ACUTE ONLY): 22 min   Charges:   PT Evaluation $PT Eval Low Complexity: 1 Procedure     PT G CodesSharen Heck PT [phone]  11/03/2015, 3:26 PM

## 2015-11-04 LAB — CBC
HCT: 42.1 % (ref 39.0–52.0)
Hemoglobin: 13.6 g/dL (ref 13.0–17.0)
MCH: 32.6 pg (ref 26.0–34.0)
MCHC: 32.3 g/dL (ref 30.0–36.0)
MCV: 101 fL — ABNORMAL HIGH (ref 78.0–100.0)
PLATELETS: 212 10*3/uL (ref 150–400)
RBC: 4.17 MIL/uL — AB (ref 4.22–5.81)
RDW: 14.9 % (ref 11.5–15.5)
WBC: 7.7 10*3/uL (ref 4.0–10.5)

## 2015-11-04 LAB — BASIC METABOLIC PANEL
Anion gap: 8 (ref 5–15)
BUN: 20 mg/dL (ref 6–20)
CO2: 29 mmol/L (ref 22–32)
Calcium: 9 mg/dL (ref 8.9–10.3)
Chloride: 98 mmol/L — ABNORMAL LOW (ref 101–111)
Creatinine, Ser: 0.89 mg/dL (ref 0.61–1.24)
GFR calc Af Amer: >60 mL/min (ref >60)
Glucose, Bld: 105 mg/dL — ABNORMAL HIGH (ref 65–99)
POTASSIUM: 4.3 mmol/L (ref 3.5–5.1)
SODIUM: 135 mmol/L (ref 135–145)

## 2015-11-04 LAB — PROTIME-INR
INR: 2.04 — ABNORMAL HIGH (ref 0.00–1.49)
PROTHROMBIN TIME: 22.2 s — AB (ref 11.6–15.2)

## 2015-11-04 MED ORDER — LORAZEPAM 0.5 MG PO TABS
0.5000 mg | ORAL_TABLET | Freq: Two times a day (BID) | ORAL | Status: DC | PRN
  Administered 2015-11-06: 0.5 mg via ORAL
  Filled 2015-11-04: qty 1

## 2015-11-04 MED ORDER — WARFARIN SODIUM 4 MG PO TABS
4.0000 mg | ORAL_TABLET | Freq: Once | ORAL | Status: AC
  Administered 2015-11-04: 4 mg via ORAL
  Filled 2015-11-04: qty 1

## 2015-11-04 MED ORDER — DIAZEPAM 2 MG PO TABS
2.0000 mg | ORAL_TABLET | Freq: Two times a day (BID) | ORAL | Status: DC
  Administered 2015-11-04 – 2015-11-05 (×2): 2 mg via ORAL
  Filled 2015-11-04 (×2): qty 1

## 2015-11-04 NOTE — Clinical Social Work Placement (Signed)
CSW met with patient & wife, Ryan Barker at bedside to discuss discharge plans - reviewed PT evaluation recommending SNF. Patient & wife are agreeable with plan for SNF, informed CSW that he had been to [facility] SNF in the past but would like to see what other options they have. CSW will follow-up with bed offers when available.     Raynaldo Opitz, [facility] Clinical Social Worker cell #: [phone]    CLINICAL SOCIAL WORK PLACEMENT  NOTE  Date:  11/04/2015  Patient Details  Name: Ryan Barker MRN: [id number] Date of Birth: October 24, 1945  Clinical Social Work is seeking post-discharge placement for this patient at the [place] level of care (*CSW will initial, date and re-position this form in  chart as items are completed):  Yes   Patient/family provided with [facility] Work Department's list of facilities offering this level of care within the geographic area requested by the patient (or if unable, by the patient's family).  Yes   Patient/family informed of their freedom to choose among providers that offer the needed level of care, that participate in Medicare, Medicaid or managed care program needed by the patient, have an available bed and are willing to accept the patient.  Yes   Patient/family informed of [facility]'s ownership interest in [facility] and [facility], as well as of the fact that they are under no obligation to receive care at these facilities.  PASRR submitted to EDS on       PASRR number received on       Existing PASRR number confirmed on 11/04/15     FL2 transmitted to all facilities in geographic area requested by pt/family on 11/04/15     FL2 transmitted to all facilities within larger geographic area on       Patient informed that his/her managed care company has contracts with or will negotiate with certain facilities, including the following:            Patient/family  informed of bed offers received.  Patient chooses bed at       Physician recommends and patient chooses bed at      Patient to be transferred to   on  .  Patient to be transferred to facility by       Patient family notified on   of transfer.  Name of family member notified:        PHYSICIAN       Additional Comment:    _______________________________________________ Standley Brooking, LCSW 11/04/2015, 2:55 PM

## 2015-11-04 NOTE — Progress Notes (Signed)
[facility] Progress Note  Patient: Ryan Barker:[id number]   PCP: Thora Lance, MD DOB: 10-Sep-1946   DOA: 10/30/2015   DOS: 11/04/2015     As per the H and P dictated on admission, "Ryan Barker is a 70 y.o. male with a Past medical history as bellow who is brought from home via EMS to the emergency department due to a scrotal swelling and incontinence.   Per patient, he has a "hidden penis" and states that when he urinates his urine is staying in his scrotum instead flowing out, causing his scrotum to swell. The patient also has been hallucinating and stating that the police is near and that they are about to arrest him for insurance fraud and charge him with a felony. His wife states that he was recently tapered off very quickly from alprazolam, which he has been using for anxiety for the past 20 years. She is states that he has been increasingly confused and delusional. He also had not taken his metoprolol and Cardizem since yesterday and was found to be in atrial fibrillation with RVR.      Subjective: vision is poor at baseline but will not wear glasses  Assessment and Plan: 1. Atrial fibrillation with rapid ventricular response [facility]) The patient is presenting with complains of hallucination. He is found to have A. fib with RVR. Known prior history of A. fib with chronic anticoagulation- coumadin Echocardiogram done TSH outpatient Cardizem increased Continue Lopressor 25 mg twice a day.   2. Acute encephalopathy with psychosis and hallucinations. Suspected benzodiazepine withdrawal Suspected serotonin syndrome The patient was having significant visual/auditory hallucination--- has lessened in the last 24 hours  -Differential diagnoses include benzodiazepine withdrawal since the patient has been on Xanax 1 mg every 8 hours as needed for more than 20 years, and recently has been tapered off due to memory issue. Patient was also recently started on Celexa, one week ago,  and his presentation can also be explained by serotonin syndrome. Regardless of treatment is similar. We will continue with IV fluids, use scheduled Valium every 12 hours. Lorazepam every 4 hours as needed for increased tremors and agitation. Discontinue Celexa. Psych consult appreciated-  Does not meet criteria for inpt psych  3. Dementia with depression. Currently holding Celexa. Continue benzodiazepine- decrease valium  4. Hypokalemia, hypomagnesemia, lactic acidosis. All more likely secondary to dehydration. continue to monitor.  5. essential hypertension. Currently holding lisinopril while the patient is receiving rate controlling medications.  6.dyslipidemia. Continuing statin.  7.GERD. On Protonix instead of omeprazole at home.   DVT Prophylaxis: on therapeutic anticoagulation. Nutrition: cardiac diet Advance goals of care discussion: full code   Consultants: psych  Antibiotics: Anti-infectives    None      Family Communication: wife at bedside    Intake/Output Summary (Last 24 hours) at 11/04/15 1520 Last data filed at 11/04/15 1019  Gross per 24 hour  Intake    400 ml  Output   1155 ml  Net   -755 ml   Filed Weights   10/30/15 2200 11/03/15 0441  Weight: 125.7 kg (277 lb 1.9 oz) 130.6 kg (287 lb 14.7 oz)    Objective: Physical Exam: Filed Vitals:   11/04/15 1100 11/04/15 1157 11/04/15 1435 11/04/15 1441  BP: 148/82 148/82 129/86 129/86  Pulse: 97  72 72  Temp:   98.1 F (36.7 C)   TempSrc:   Oral   Resp:   20   Height:      Weight:  SpO2: 98%  100%     General: NAD- eating lunch, talkative Cardiovascular: S1 and S2 Present, no Murmur, no JVD Respiratory: Bilateral Air entry present and Clear to Auscultation, no Crackles, no wheezes Abdomen: Bowel Sound present, Soft and no tenderness Extremities: no Pedal edema, no calf tenderness   Data Reviewed: CBC:  Recent Labs Lab 10/30/15 0539  10/31/15 0134 11/01/15 0320  11/02/15 0350 11/03/15 0337 11/04/15 0535  WBC 8.8  --  10.0 6.2 6.4 6.4 7.7  NEUTROABS 6.8  --  6.8  --   --   --   --   HGB 15.6  < > 14.7 13.8 13.6 12.9* 13.6  HCT 45.7  < > 43.0 41.8 41.7 38.8* 42.1  MCV 97.4  --  99.5 101.5* 102.0* 100.8* 101.0*  PLT 253  --  251 207 191 170 212  < > = values in this interval not displayed. Basic Metabolic Panel:  Recent Labs Lab 10/30/15 0539  10/31/15 1712 11/01/15 0320 11/02/15 0350 11/03/15 0337 11/04/15 0535  NA 135  < > 142 140 139 140 135  K 2.9*  < > 3.8 3.4* 3.8 4.1 4.3  CL 96*  < > 101 100* 101 103 98*  CO2 26  < > 27 30 28 29 29   GLUCOSE 130*  < > 126* 111* 104* 99 105*  BUN 16  < > 21* 18 20 22* 20  CREATININE 0.97  < > 1.22 1.01 0.89 0.91 0.89  CALCIUM 9.0  < > 9.0 8.5* 8.5* 8.6* 9.0  MG 1.3*  --  1.7 1.6* 1.8  --   --   PHOS 2.0*  --   --   --   --   --   --   < > = values in this interval not displayed. Liver Function Tests:  Recent Labs Lab 10/30/15 0539 10/31/15 0134  AST 26 23  ALT 11* 12*  ALKPHOS 93 83  BILITOT 0.8 1.1  PROT 7.5 6.9  ALBUMIN 4.5 4.2   No results for input(s): LIPASE, AMYLASE in the last 168 hours. No results for input(s): AMMONIA in the last 168 hours.  Cardiac Enzymes:  Recent Labs Lab 10/30/15 2036 10/31/15 0131  TROPONINI 0.06* 0.06*    BNP (last 3 results) No results for input(s): BNP in the last 8760 hours.  CBG: No results for input(s): GLUCAP in the last 168 hours.  Recent Results (from the past 240 hour(s))  Urine culture     Status: None   Collection Time: 10/30/15  5:48 AM  Result Value Ref Range Status   Specimen Description URINE, CLEAN CATCH  Final   Special Requests NONE  Final   Culture   Final    MULTIPLE SPECIES PRESENT, SUGGEST RECOLLECTION Performed at [place] [facility]    Report Status 10/31/2015 FINAL  Final  MRSA PCR Screening     Status: None   Collection Time: 10/30/15  9:13 PM  Result Value Ref Range Status   MRSA by PCR NEGATIVE NEGATIVE  Final    Comment:        The GeneXpert MRSA Assay (FDA approved for NASAL specimens only), is one component of a comprehensive MRSA colonization surveillance program. It is not intended to diagnose MRSA infection nor to guide or monitor treatment for MRSA infections.      Studies: No results found.   Scheduled Meds: . acetaminophen  650 mg Oral 3 times per day  . allopurinol  300 mg Oral Daily  .  diazepam  2 mg Oral BID  . diltiazem  90 mg Oral 4 times per day  . feeding supplement (ENSURE ENLIVE)  237 mL Oral BID BM  . LORazepam  1 mg Intravenous Once  . metoprolol tartrate  25 mg Oral 3 times per day  . multivitamin with minerals  1 tablet Oral Daily  . nystatin   Topical TID  . ofloxacin  1 drop Left Eye QID  . pantoprazole  40 mg Oral Daily  . polyethylene glycol  17 g Oral Daily  . pravastatin  40 mg Oral QHS  . thiamine  100 mg Oral Daily  . Warfarin - Pharmacist Dosing Inpatient   Does not apply q1800   Continuous Infusions:   PRN Meds: hydrOXYzine, loperamide, LORazepam, metoprolol, ondansetron (ZOFRAN) IV, ondansetron  Time spent: 25 minutes  Marlin Canary DO [phone]   11/04/2015 3:20 PM  If 7PM-7AM, please contact night-coverage at [email or web address], password [facility]

## 2015-11-04 NOTE — Care Management Important Message (Signed)
Important Message  Patient Details  Name: [name] MRN: [id number] Date of Birth: [birth year]   Medicare Important Message Given:  Yes    Haskell Flirt 11/04/2015, 12:58 PMImportant Message  Patient Details  Name: [name] MRN: [id number] Date of Birth: Jun 24, 1946   Medicare Important Message Given:  Yes    Haskell Flirt 11/04/2015, 12:58 PM

## 2015-11-04 NOTE — NC FL2 (Signed)
[place] MEDICAID FL2 LEVEL OF CARE SCREENING TOOL     IDENTIFICATION  Patient Name: Ryan Barker Birthdate: 03/26/1946 Sex: male Admission Date (Current Location): 10/30/2015  [facility] and [place] Number:  Producer, television/film/video and Address:  [facility]  [address] [place], [place] [address]      Provider Number: [id number]  Attending Physician Name and Address:  Joseph Art, DO  Relative Name and Phone Number:       Current Level of Care: Hospital Recommended Level of Care: Skilled Nursing Facility Prior Approval Number:    Date Approved/Denied:   PASRR Number: [id number]  Discharge Plan: SNF    Current Diagnoses: Patient Active Problem List   Diagnosis Date Noted  . Serotonin syndrome 10/31/2015  . GERD (gastroesophageal reflux disease) 10/31/2015  . Atrial fibrillation with rapid ventricular response (HCC) 10/30/2015  . 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 Hypokalemia 10/30/2015  . Atrial fibrillation with RVR (HCC) 10/30/2015  . Hypomagnesemia 10/30/2015  . Dementia with behavioral disturbance 10/24/2015  . 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 Alteration in self-care ability   . Chronic atrial fibrillation (HCC) 06/24/2013    Class: Chronic  . Long term current use of anticoagulant therapy 06/24/2013  . Other and unspecified hyperlipidemia 06/24/2013  . Essential hypertension, benign 06/24/2013    Orientation RESPIRATION BLADDER Height & Weight     Self, Situation, Time, Place  Normal Incontinent Weight: 287 lb 14.7 oz (130.6 kg) Height:   (175.3 cm)  BEHAVIORAL SYMPTOMS/MOOD NEUROLOGICAL BOWEL NUTRITION STATUS      Continent Diet (Heart)  AMBULATORY STATUS COMMUNICATION OF NEEDS Skin   Extensive Assist Verbally Normal                       Personal Care Assistance Level of Assistance  Bathing, Dressing Bathing Assistance: Limited assistance   Dressing Assistance: Limited  assistance     Functional Limitations Info             SPECIAL CARE FACTORS FREQUENCY  PT (By licensed PT), OT (By licensed OT)     PT Frequency: 5 OT Frequency: 5            Contractures      Additional Factors Info  Code Status, Allergies Code Status Info: Fullcode Allergies Info: NKDA           Current Medications (11/04/2015):  This is the current hospital active medication list Current Facility-Administered Medications  Medication Dose Route Frequency Provider Last Rate Last Dose  . acetaminophen (TYLENOL) tablet 650 mg  650 mg Oral 3 times per day Bobette Mo, MD   650 mg at 11/04/15 1441  . allopurinol (ZYLOPRIM) tablet 300 mg  300 mg Oral Daily Bobette Mo, MD   300 mg at 11/04/15 0959  . diazepam (VALIUM) tablet 2 mg  2 mg Oral BID Joseph Art, DO      . diltiazem (CARDIZEM) tablet 90 mg  90 mg Oral 4 times per day Joseph Art, DO   90 mg at 11/04/15 1158  . feeding supplement (ENSURE ENLIVE) (ENSURE ENLIVE) liquid 237 mL  237 mL Oral BID BM Bobette Mo, MD   237 mL at 11/04/15 1442  . hydrOXYzine (ATARAX/VISTARIL) tablet 25 mg  25 mg Oral Q6H PRN Joseph Art, DO   25 mg at 11/04/15 5621  . loperamide (IMODIUM) capsule 2-4 mg  2-4 mg Oral PRN Shanda Bumps  U Vann, DO      . LORazepam (ATIVAN) injection 1 mg  1 mg Intravenous Once Rolly Salter, MD      . LORazepam (ATIVAN) tablet 0.5 mg  0.5 mg Oral Q12H PRN Joseph Art, DO      . metoprolol (LOPRESSOR) injection 2.5 mg  2.5 mg Intravenous Q4H PRN Rolly Salter, MD      . metoprolol tartrate (LOPRESSOR) tablet 25 mg  25 mg Oral 3 times per day Rolly Salter, MD   25 mg at 11/04/15 1442  . multivitamin with minerals tablet 1 tablet  1 tablet Oral Daily Joseph Art, DO   1 tablet at 11/04/15 1000  . nystatin (MYCOSTATIN/NYSTOP) topical powder   Topical TID Bobette Mo, MD      . ofloxacin (OCUFLOX) 0.3 % ophthalmic solution 1 drop  1 drop Left Eye QID Rolly Salter, MD   1  drop at 11/04/15 1447  . ondansetron (ZOFRAN) injection 4 mg  4 mg Intravenous Q6H PRN Bobette Mo, MD   4 mg at 11/03/15 1218  . ondansetron (ZOFRAN-ODT) disintegrating tablet 4 mg  4 mg Oral Q6H PRN Joseph Art, DO      . pantoprazole (PROTONIX) EC tablet 40 mg  40 mg Oral Daily Bobette Mo, MD   40 mg at 11/04/15 1000  . polyethylene glycol (MIRALAX / GLYCOLAX) packet 17 g  17 g Oral Daily Jessica U Vann, DO   17 g at 11/04/15 1000  . pravastatin (PRAVACHOL) tablet 40 mg  40 mg Oral QHS Bobette Mo, MD   40 mg at 11/03/15 2305  . thiamine (VITAMIN B-1) tablet 100 mg  100 mg Oral Daily Joseph Art, DO   100 mg at 11/04/15 1000  . Warfarin - Pharmacist Dosing Inpatient   Does not apply q1800 [facility] Rast, [facility]   Stopped at 11/02/15 1412     Discharge Medications: Please see discharge summary for a list of discharge medications.  Relevant Imaging Results:  Relevant Lab Results:   Additional Information SSN: [id number]  Arlyss Repress, LCSW

## 2015-11-04 NOTE — Progress Notes (Signed)
ANTICOAGULATION CONSULT NOTE - Follow up  Pharmacy Consult for Warfarin Indication: atrial fibrillation  No Known Allergies  Patient Measurements: Height:  (175.3 cm) Weight: 287 lb 14.7 oz (130.6 kg) IBW/kg (Calculated) : 70.7  Vital Signs: Temp: 98 F (36.7 C) (02/24 0447) Temp Source: Oral (02/24 0447) BP: 147/89 mmHg (02/24 0447) Pulse Rate: 87 (02/24 0447)  Labs:  Recent Labs  11/02/15 0350 11/03/15 0337 11/04/15 0535  HGB 13.6 12.9* 13.6  HCT 41.7 38.8* 42.1  PLT 191 170 212  LABPROT 30.1* 32.4* 22.2*  INR 3.06* 3.24* 2.04*  CREATININE 0.89 0.91 0.89    Estimated Creatinine Clearance: 102 mL/min (by C-G formula based on Cr of 0.89).  Medications:  Scheduled:  . acetaminophen  650 mg Oral 3 times per day  . allopurinol  300 mg Oral Daily  . diazepam  5 mg Oral BID  . diltiazem  90 mg Oral 4 times per day  . feeding supplement (ENSURE ENLIVE)  237 mL Oral BID BM  . LORazepam  1 mg Intravenous Once  . metoprolol tartrate  25 mg Oral 3 times per day  . multivitamin with minerals  1 tablet Oral Daily  . nystatin   Topical TID  . ofloxacin  1 drop Left Eye QID  . pantoprazole  40 mg Oral Daily  . polyethylene glycol  17 g Oral Daily  . pravastatin  40 mg Oral QHS  . thiamine  100 mg Oral Daily  . Warfarin - Pharmacist Dosing Inpatient   Does not apply q1800   Infusions:     Assessment: 58 yoM [facility] ED [facility] with scrotal swelling, anxiety.  His PMH includes atrial fibrillation on chronic warfarin anticoagulation.  He is noted to have Afib with RVR.  Pharmacy is consulted to continue warfarin dosing inpatient.  Home warfarin dose is reported as 2.5 mg daily except  on Tuesday/Fridays.  Last dose was 2/18.  Admission INR is 1.8.  Today, 11/04/2015:  Pt transferred out of ICU to 4th floor  INR decreased to 2.04 after being supratherapeutic and coumadin held last 2 days.    CBC: Hgb/Plt ok - no issues.   Diet: heart healthy; eating  100% of meals  No major drug-drug interactions noted  Goal of Therapy:  INR 2-3 Monitor platelets by anticoagulation protocol: Yes   Plan:   Warfarin  today at noon today as INR may decrease further due to held doses  Remains on SCDs  Daily INR and CBC  Haynes Hoehn, PharmD, BCPS 11/04/2015, 8:08 AM  Pager: [phone]

## 2015-11-05 LAB — PROTIME-INR
INR: 1.77 — AB (ref 0.00–1.49)
Prothrombin Time: 20 seconds — ABNORMAL HIGH (ref 11.6–15.2)

## 2015-11-05 MED ORDER — DIAZEPAM 2 MG PO TABS
1.0000 mg | ORAL_TABLET | Freq: Two times a day (BID) | ORAL | Status: DC
Start: 2015-11-05 — End: 2015-11-07
  Administered 2015-11-05 – 2015-11-07 (×4): 1 mg via ORAL
  Filled 2015-11-05 (×4): qty 1

## 2015-11-05 MED ORDER — QUETIAPINE FUMARATE 25 MG PO TABS
25.0000 mg | ORAL_TABLET | Freq: Every day | ORAL | Status: DC
  Administered 2015-11-05 – 2015-11-06 (×2): 25 mg via ORAL
  Filled 2015-11-05 (×2): qty 1

## 2015-11-05 MED ORDER — WARFARIN SODIUM 5 MG PO TABS
5.0000 mg | ORAL_TABLET | Freq: Once | ORAL | Status: AC
  Administered 2015-11-05: 5 mg via ORAL
  Filled 2015-11-05: qty 1

## 2015-11-05 NOTE — Clinical Social Work Note (Signed)
CSW spoke to patient's family to present bed offers, patient and family would like to go to [facility] for short term rehab.  CSW contacted [facility] admissions and left a message that patient is interested in SNF.  Patient expressed that he is a very spiritual person and would like to speak to a chaplain, who was paged to speak with patient and his wife.  CSW was informed by patient that he has been hearing voices and would like to speak to psychiatrist, CSW informed bedside nurse who will contact patient's MD.  CSW to continue to follow patient's progress throughout discharge plan.  Ryan Barker. Ryan Barker, MSW, Theresia Majors [phone] 11/05/2015 4:17 PM

## 2015-11-05 NOTE — Progress Notes (Signed)
[facility] Progress Note  Patient: Ryan Barker WJX:[id number]   PCP: Thora Lance, MD DOB: 1945-12-08   DOA: 10/30/2015   DOS: 11/05/2015     As per the H and P dictated on admission, "Ryan Barker is a 70 y.o. male with a Past medical history as bellow who is brought from home via EMS to the emergency department due to a scrotal swelling and incontinence.   Per patient, he has a "hidden penis" and states that when he urinates his urine is staying in his scrotum instead flowing out, causing his scrotum to swell. The patient also has been hallucinating and stating that the police is near and that they are about to arrest him for insurance fraud and charge him with a felony. His wife states that he was recently tapered off very quickly from alprazolam, which he has been using for anxiety for the past 20 years. She is states that he has been increasingly confused and delusional. He also had not taken his metoprolol and Cardizem since yesterday and was found to be in atrial fibrillation with RVR.      Subjective:  Continues to focus on the hallucinations he had earlier in his hospitalizations  Assessment and Plan: 1. Atrial fibrillation with rapid ventricular response [facility]) The patient is presenting with complains of hallucination. He is found to have A. fib with RVR. Known prior history of A. fib with chronic anticoagulation- coumadin Echocardiogram done TSH outpatient Cardizem increased Continue Lopressor 25 mg twice a day.   2. Acute encephalopathy with psychosis and hallucinations. Suspected benzodiazepine withdrawal Suspected serotonin syndrome The patient was having significant visual/auditory hallucination--- has lessened in the last 24 hours  -Differential diagnoses include benzodiazepine withdrawal since the patient has been on Xanax 1 mg every 8 hours as needed for more than 20 years, and recently has been tapered off due to memory issue. Patient was also recently  started on Celexa, one week ago, and his presentation can also be explained by serotonin syndrome. Regardless of treatment is similar. We will continue with IV fluids, use scheduled Valium every 12 hours. Lorazepam every 4 hours as needed for increased tremors and agitation. Discontinue Celexa. Psych consult appreciated-  Does not meet criteria for inpt psych  3. Dementia with depression. Currently holding Celexa. Continue benzodiazepine- decrease valium  4. Hypokalemia, hypomagnesemia, lactic acidosis. All more likely secondary to dehydration. continue to monitor.  5. essential hypertension. Currently holding lisinopril while the patient is receiving rate controlling medications.  6.dyslipidemia. Continuing statin.  7.GERD. On Protonix instead of omeprazole at home.   DVT Prophylaxis: on therapeutic anticoagulation. Nutrition: cardiac diet Advance goals of care discussion: full code   Consultants: psych  Antibiotics: Anti-infectives    None      Family Communication: wife at bedside 2/24    Intake/Output Summary (Last 24 hours) at 11/05/15 1411 Last data filed at 11/05/15 0900  Gross per 24 hour  Intake    480 ml  Output    420 ml  Net     60 ml   Filed Weights   10/30/15 2200 11/03/15 0441  Weight: 125.7 kg (277 lb 1.9 oz) 130.6 kg (287 lb 14.7 oz)    Objective: Physical Exam: Filed Vitals:   11/05/15 0023 11/05/15 0438 11/05/15 0613 11/05/15 1345  BP: 112/72 169/79 157/108 149/80  Pulse: 65 80 89 74  Temp:  98.4 F (36.9 C)  98.1 F (36.7 C)  TempSrc:  Oral  Oral  Resp:  20  16  Height:      Weight:      SpO2:  99%  98%    General: NAD-  talkative Cardiovascular: S1 and S2 Present, no Murmur, no JVD Respiratory: Bilateral Air entry present and Clear to Auscultation, no Crackles, no wheezes Abdomen: Bowel Sound present, Soft and no tenderness Extremities: no Pedal edema, no calf tenderness   Data Reviewed: CBC:  Recent Labs Lab  10/30/15 0539  10/31/15 0134 11/01/15 0320 11/02/15 0350 11/03/15 0337 11/04/15 0535  WBC 8.8  --  10.0 6.2 6.4 6.4 7.7  NEUTROABS 6.8  --  6.8  --   --   --   --   HGB 15.6  < > 14.7 13.8 13.6 12.9* 13.6  HCT 45.7  < > 43.0 41.8 41.7 38.8* 42.1  MCV 97.4  --  99.5 101.5* 102.0* 100.8* 101.0*  PLT 253  --  251 207 191 170 212  < > = values in this interval not displayed. Basic Metabolic Panel:  Recent Labs Lab 10/30/15 0539  10/31/15 1712 11/01/15 0320 11/02/15 0350 11/03/15 0337 11/04/15 0535  NA 135  < > 142 140 139 140 135  K 2.9*  < > 3.8 3.4* 3.8 4.1 4.3  CL 96*  < > 101 100* 101 103 98*  CO2 26  < > GLUCOSE 130*  < > 126* 111* 104* 99 105*  BUN 16  < > 21* 18 20 22* 20  CREATININE 0.97  < > 1.22 1.01 0.89 0.91 0.89  CALCIUM 9.0  < > 9.0 8.5* 8.5* 8.6* 9.0  MG 1.3*  --  1.7 1.6* 1.8  --   --   PHOS 2.0*  --   --   --   --   --   --   < > = values in this interval not displayed. Liver Function Tests:  Recent Labs Lab 10/30/15 0539 10/31/15 0134  AST 26 23  ALT 11* 12*  ALKPHOS 93 83  BILITOT 0.8 1.1  PROT 7.5 6.9  ALBUMIN 4.5 4.2   No results for input(s): LIPASE, AMYLASE in the last 168 hours. No results for input(s): AMMONIA in the last 168 hours.  Cardiac Enzymes:  Recent Labs Lab 10/30/15 2036 10/31/15 0131  TROPONINI 0.06* 0.06*    BNP (last 3 results) No results for input(s): BNP in the last 8760 hours.  CBG: No results for input(s): GLUCAP in the last 168 hours.  Recent Results (from the past 240 hour(s))  Urine culture     Status: None   Collection Time: 10/30/15  5:48 AM  Result Value Ref Range Status   Specimen Description URINE, CLEAN CATCH  Final   Special Requests NONE  Final   Culture   Final    MULTIPLE SPECIES PRESENT, SUGGEST RECOLLECTION Performed at [facility]    Report Status 10/31/2015 FINAL  Final  MRSA PCR Screening     Status: None   Collection Time: 10/30/15  9:13 PM  Result Value Ref  Range Status   MRSA by PCR NEGATIVE NEGATIVE Final    Comment:        The GeneXpert MRSA Assay (FDA approved for NASAL specimens only), is one component of a comprehensive MRSA colonization surveillance program. It is not intended to diagnose MRSA infection nor to guide or monitor treatment for MRSA infections.      Studies: No results found.   Scheduled Meds: . acetaminophen  650 mg  Oral 3 times per day  . allopurinol  300 mg Oral Daily  . diazepam  2 mg Oral BID  . diltiazem  90 mg Oral 4 times per day  . feeding supplement (ENSURE ENLIVE)  237 mL Oral BID BM  . LORazepam  1 mg Intravenous Once  . metoprolol tartrate  25 mg Oral 3 times per day  . multivitamin with minerals  1 tablet Oral Daily  . nystatin   Topical TID  . ofloxacin  1 drop Left Eye QID  . pantoprazole  40 mg Oral Daily  . polyethylene glycol  17 g Oral Daily  . pravastatin  40 mg Oral QHS  . thiamine  100 mg Oral Daily  . warfarin  5 mg Oral ONCE-1800  . Warfarin - Pharmacist Dosing Inpatient   Does not apply q1800   Continuous Infusions:   PRN Meds: hydrOXYzine, loperamide, LORazepam, metoprolol, ondansetron (ZOFRAN) IV, ondansetron  Time spent: 25 minutes  Marlin Canary DO [phone]   11/05/2015 2:11 PM  If 7PM-7AM, please contact night-coverage at [email or web address], password [email or web address]

## 2015-11-05 NOTE — Progress Notes (Signed)
Pt was awake and lying in bed when I arrived. His wife was bedside holding his hand. Pt described hearing voices. He said he thinks its coming from meds he had been taking for years is now trying something else. Several times during our visit he had a brief blank stare and then he said he was hearing the voices. On most occasions he spoke to the voices and told them to go away or be quiet. 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 He talked about other coping mechanisms like taking deep breaths while counting to 4 and then exhaling. Pt said the sometimes the voices said bad things and sometimes good things. He said the tv is a distraction and lights are helpful. He described an event last night where he said he had a mental (or reality) power outage where all the lights and tv went out, but in reality they were still on. He described how he brought himself back to reality and the voices said good job. He said other times they laugh at him or mock him. In addition to coping mechanisms we talked about Scriptures that may also be helpful. Pt seems cooperative to any suggestions and medicines that may be helpful. Pt wanted prayer w/himself and wife. Please page if additional support is needed. Chaplain Marjory Lies, M.Div.   11/05/15 1700  Clinical Encounter Type  Visited With Patient and family together

## 2015-11-05 NOTE — Progress Notes (Signed)
ANTICOAGULATION CONSULT NOTE - Follow up  Pharmacy Consult for Warfarin Indication: atrial fibrillation  No Known Allergies  Patient Measurements: Height:  (175.3 cm) Weight: 287 lb 14.7 oz (130.6 kg) IBW/kg (Calculated) : 70.7  Vital Signs: Temp: 98.4 F (36.9 C) (02/25 0438) Temp Source: Oral (02/25 0438) BP: 157/108 mmHg (02/25 0613) Pulse Rate: 89 (02/25 0613)  Labs:  Recent Labs  11/03/15 0337 11/04/15 0535 11/05/15 0535  HGB 12.9* 13.6  --   HCT 38.8* 42.1  --   PLT 170 212  --   LABPROT 32.4* 22.2* 20.0*  INR 3.24* 2.04* 1.77*  CREATININE 0.91 0.89  --     Estimated Creatinine Clearance: 102 mL/min (by C-G formula based on Cr of 0.89).  Medications:  Scheduled:  . acetaminophen  650 mg Oral 3 times per day  . allopurinol  300 mg Oral Daily  . diazepam  2 mg Oral BID  . diltiazem  90 mg Oral 4 times per day  . feeding supplement (ENSURE ENLIVE)  237 mL Oral BID BM  . LORazepam  1 mg Intravenous Once  . metoprolol tartrate  25 mg Oral 3 times per day  . multivitamin with minerals  1 tablet Oral Daily  . nystatin   Topical TID  . ofloxacin  1 drop Left Eye QID  . pantoprazole  40 mg Oral Daily  . polyethylene glycol  17 g Oral Daily  . pravastatin  40 mg Oral QHS  . thiamine  100 mg Oral Daily  . Warfarin - Pharmacist Dosing Inpatient   Does not apply q1800   Infusions:     Assessment: 63 yoM presented to [facility] ED on 2/19 with scrotal swelling, anxiety.  His PMH includes atrial fibrillation on chronic warfarin anticoagulation.  He is noted to have Afib with RVR.  Pharmacy is consulted to continue warfarin dosing inpatient.  Home warfarin dose is reported as 2.5 mg daily except  on Tuesday/Fridays.  Last dose was 2/18.  Admission INR is 1.8.  Today, 11/05/2015:  Pt transferred out of ICU to 4th floor  INR decreased to 1.77 after being supratherapeutic and coumadin held on 2/22 and 2/23.    CBC: Hgb/Plt ok - no issues.   Diet: heart healthy;  eating 100% of meals  No major drug-drug interactions noted  Goal of Therapy:  INR 2-3 Monitor platelets by anticoagulation protocol: Yes   Plan:   Warfarin  po x 1 today @ 1800  Remains on SCDs  Daily INR and CBC  Arley Phenix RPh 11/05/2015, 8:11 AM Pager [phone]

## 2015-11-06 LAB — CBC
HCT: 41.8 % (ref 39.0–52.0)
HEMOGLOBIN: 13.8 g/dL (ref 13.0–17.0)
MCH: 33.3 pg (ref 26.0–34.0)
MCHC: 33 g/dL (ref 30.0–36.0)
MCV: 101 fL — ABNORMAL HIGH (ref 78.0–100.0)
PLATELETS: 204 10*3/uL (ref 150–400)
RBC: 4.14 MIL/uL — AB (ref 4.22–5.81)
RDW: 14.9 % (ref 11.5–15.5)
WBC: 8.9 10*3/uL (ref 4.0–10.5)

## 2015-11-06 LAB — BASIC METABOLIC PANEL
ANION GAP: 12 (ref 5–15)
BUN: 21 mg/dL — AB (ref 6–20)
CHLORIDE: 98 mmol/L — AB (ref 101–111)
CO2: 27 mmol/L (ref 22–32)
Calcium: 8.9 mg/dL (ref 8.9–10.3)
Creatinine, Ser: 0.98 mg/dL (ref 0.61–1.24)
GFR calc Af Amer: >60 mL/min (ref >60)
Glucose, Bld: 104 mg/dL — ABNORMAL HIGH (ref 65–99)
POTASSIUM: 3.9 mmol/L (ref 3.5–5.1)
SODIUM: 137 mmol/L (ref 135–145)

## 2015-11-06 LAB — PROTIME-INR
INR: 1.59 — ABNORMAL HIGH (ref 0.00–1.49)
PROTHROMBIN TIME: 18.4 s — AB (ref 11.6–15.2)

## 2015-11-06 MED ORDER — WARFARIN SODIUM 5 MG PO TABS
5.0000 mg | ORAL_TABLET | Freq: Once | ORAL | Status: AC
  Administered 2015-11-06: 5 mg via ORAL
  Filled 2015-11-06: qty 1

## 2015-11-06 NOTE — Progress Notes (Signed)
Chaplain responded to the request of the Nursing Unit to provide spiritual care support.  The ministry of presence allowed the patient to talk about his present medical needs. Chaplain listened empathically to the patient and encouraged him to  utilize his coping skill he states he uses when he feels his anxiety building up.  Chaplain offered a prayer of comfort and healing on his behalf. Chaplain Janell Quiet [phone]

## 2015-11-06 NOTE — Progress Notes (Signed)
ANTICOAGULATION CONSULT NOTE - Follow up  Pharmacy Consult for Warfarin Indication: atrial fibrillation  No Known Allergies  Patient Measurements: Height:  (175.3 cm) Weight: 287 lb 14.7 oz (130.6 kg) IBW/kg (Calculated) : 70.7  Vital Signs: Temp: 98.2 F (36.8 C) (02/26 0511) Temp Source: Oral (02/26 0511) BP: 155/98 mmHg (02/26 0511) Pulse Rate: 70 (02/26 0511)  Labs:  Recent Labs  11/04/15 0535 11/05/15 0535 11/06/15 0518  HGB 13.6  --  13.8  HCT 42.1  --  41.8  PLT 212  --  204  LABPROT 22.2* 20.0* 18.4*  INR 2.04* 1.77* 1.59*  CREATININE 0.89  --  0.98    Estimated Creatinine Clearance: 92.6 mL/min (by C-G formula based on Cr of 0.98).  Medications:  Scheduled:  . acetaminophen  650 mg Oral 3 times per day  . allopurinol  300 mg Oral Daily  . diazepam  1 mg Oral BID  . diltiazem  90 mg Oral 4 times per day  . feeding supplement (ENSURE ENLIVE)  237 mL Oral BID BM  . LORazepam  1 mg Intravenous Once  . metoprolol tartrate  25 mg Oral 3 times per day  . multivitamin with minerals  1 tablet Oral Daily  . nystatin   Topical TID  . pantoprazole  40 mg Oral Daily  . polyethylene glycol  17 g Oral Daily  . pravastatin  40 mg Oral QHS  . QUEtiapine  25 mg Oral QHS  . thiamine  100 mg Oral Daily  . Warfarin - Pharmacist Dosing Inpatient   Does not apply q1800   Infusions:     Assessment: 63 yoM presented to [facility] ED on 2/19 with scrotal swelling, anxiety.  His PMH includes atrial fibrillation on chronic warfarin anticoagulation.  He is noted to have Afib with RVR.  Pharmacy is consulted to continue warfarin dosing inpatient.  Home warfarin dose is reported as 2.5 mg daily except  on Tuesday/Fridays.  Last dose was 2/18.  Admission INR is 1.8.  Today, 11/06/2015:  INR continues to decrease to 1.59 after being supratherapeutic and coumadin held on 2/22 and 2/23.    CBC: Hgb/Plt ok - no issues.   Diet: heart healthy; eating 100% of meals  No major  drug-drug interactions noted  Goal of Therapy:  INR 2-3 Monitor platelets by anticoagulation protocol: Yes   Plan:   Warfarin  po x 1 today @ 1800  Daily INR and CBC  Arley Phenix RPh 11/06/2015, 11:12 AM Pager [phone]

## 2015-11-06 NOTE — Progress Notes (Signed)
[facility] Progress Note  Patient: Ryan Barker ZOX:[id number]   PCP: Thora Lance, MD DOB: 08-20-1946   DOA: 10/30/2015   DOS: 11/06/2015     As per the H and P dictated on admission, "Ryan Barker is a 70 y.o. male with a Past medical history as bellow who is brought from home via EMS to the emergency department due to a scrotal swelling and incontinence.   Per patient, he has a "hidden penis" and states that when he urinates his urine is staying in his scrotum instead flowing out, causing his scrotum to swell. The patient also has been hallucinating and stating that the police is near and that they are about to arrest him for insurance fraud and charge him with a felony. His wife states that he was recently tapered off very quickly from alprazolam, which he has been using for anxiety for the past 20 years. She is states that he has been increasingly confused and delusional. He also had not taken his metoprolol and Cardizem since yesterday and was found to be in atrial fibrillation with RVR.      Subjective:  Did well last night, but got anxious when the CNA left room -likes to be fed and have company  Assessment and Plan: 1. Atrial fibrillation with rapid ventricular response (HCC) Known prior history of A. fib with chronic anticoagulation- coumadin Echocardiogram done TSH outpatient Cardizem increased Continue Lopressor 25 mg twice a day.  2. Acute encephalopathy with psychosis and hallucinations. Suspected benzodiazepine withdrawal Suspected serotonin syndrome The patient was having significant visual/auditory hallucination--- has lessened in the last 24 hours  -Differential diagnoses include benzodiazepine withdrawal since the patient has been on Xanax 1 mg every 8 hours as needed for more than 20 years, and recently has been tapered off due to memory issue. Patient was also recently started on Celexa, one week ago, and his presentation can also be explained by  serotonin syndrome. Regardless of treatment is similar. scheduled Valium every 12 hours. Lorazepam every 4 hours as needed for increased tremors and agitation. Discontinue Celexa. Psych consult appreciated-  Does not meet criteria for inpt psych  3. Dementia with depression. Currently holding Celexa. Continue benzodiazepine- decrease valium  4. Hypokalemia, hypomagnesemia, lactic acidosis. All more likely secondary to dehydration. continue to monitor.  5. essential hypertension. Currently holding lisinopril while the patient is receiving rate controlling medications.  6.dyslipidemia. Continuing statin.  7.GERD. On Protonix instead of omeprazole at home.   DVT Prophylaxis: on therapeutic anticoagulation. Nutrition: cardiac diet Advance goals of care discussion: full code   SNF in AM?  Consultants: psych  Antibiotics: Anti-infectives    None      Family Communication: wife at bedside 2/24    Intake/Output Summary (Last 24 hours) at 11/06/15 1333 Last data filed at 11/06/15 0945  Gross per 24 hour  Intake   2400 ml  Output      1 ml  Net   2399 ml   Filed Weights   10/30/15 2200 11/03/15 0441  Weight: 125.7 kg (277 lb 1.9 oz) 130.6 kg (287 lb 14.7 oz)    Objective: Physical Exam: Filed Vitals:   11/05/15 0613 11/05/15 1345 11/05/15 2031 11/06/15 0511  BP: 157/108 149/80 155/87 155/98  Pulse: 89 74 78 70  Temp:  98.1 F (36.7 C) 97.7 F (36.5 C) 98.2 F (36.8 C)  TempSrc:  Oral Oral Oral  Resp:  16 16 16   Height:      Weight:  SpO2:  98% 99% 97%    General: NAD-  talkative Cardiovascular: S1 and S2 Present, no Murmur, no JVD Respiratory: Bilateral Air entry present and Clear to Auscultation, no Crackles, no wheezes Abdomen: Bowel Sound present, Soft and no tenderness Extremities: no Pedal edema, no calf tenderness   Data Reviewed: CBC:  Recent Labs Lab 10/31/15 0134 11/01/15 0320 11/02/15 0350 11/03/15 0337 11/04/15 0535  11/06/15 0518  WBC 10.0 6.2 6.4 6.4 7.7 8.9  NEUTROABS 6.8  --   --   --   --   --   HGB 14.7 13.8 13.6 12.9* 13.6 13.8  HCT 43.0 41.8 41.7 38.8* 42.1 41.8  MCV 99.5 101.5* 102.0* 100.8* 101.0* 101.0*  PLT 251 207 191 170 212 204   Basic Metabolic Panel:  Recent Labs Lab 10/31/15 1712 11/01/15 0320 11/02/15 0350 11/03/15 0337 11/04/15 0535 11/06/15 0518  NA 142 140 139 140 135 137  K 3.8 3.4* 3.8 4.1 4.3 3.9  CL 101 100* 101 103 98* 98*  CO2 GLUCOSE 126* 111* 104* 99 105* 104*  BUN 21* 18 20 22* 20 21*  CREATININE 1.22 1.01 0.89 0.91 0.89 0.98  CALCIUM 9.0 8.5* 8.5* 8.6* 9.0 8.9  MG 1.7 1.6* 1.8  --   --   --    Liver Function Tests:  Recent Labs Lab 10/31/15 0134  AST 23  ALT 12*  ALKPHOS 83  BILITOT 1.1  PROT 6.9  ALBUMIN 4.2   No results for input(s): LIPASE, AMYLASE in the last 168 hours. No results for input(s): AMMONIA in the last 168 hours.  Cardiac Enzymes:  Recent Labs Lab 10/30/15 2036 10/31/15 0131  TROPONINI 0.06* 0.06*    BNP (last 3 results) No results for input(s): BNP in the last 8760 hours.  CBG: No results for input(s): GLUCAP in the last 168 hours.  Recent Results (from the past 240 hour(s))  Urine culture     Status: None   Collection Time: 10/30/15  5:48 AM  Result Value Ref Range Status   Specimen Description URINE, CLEAN CATCH  Final   Special Requests NONE  Final   Culture   Final    MULTIPLE SPECIES PRESENT, SUGGEST RECOLLECTION Performed at [facility]    Report Status 10/31/2015 FINAL  Final  MRSA PCR Screening     Status: None   Collection Time: 10/30/15  9:13 PM  Result Value Ref Range Status   MRSA by PCR NEGATIVE NEGATIVE Final    Comment:        The GeneXpert MRSA Assay (FDA approved for NASAL specimens only), is one component of a comprehensive MRSA colonization surveillance program. It is not intended to diagnose MRSA infection nor to guide or monitor treatment for MRSA  infections.      Studies: No results found.   Scheduled Meds: . acetaminophen  650 mg Oral 3 times per day  . allopurinol  300 mg Oral Daily  . diazepam  1 mg Oral BID  . diltiazem  90 mg Oral 4 times per day  . feeding supplement (ENSURE ENLIVE)  237 mL Oral BID BM  . LORazepam  1 mg Intravenous Once  . metoprolol tartrate  25 mg Oral 3 times per day  . multivitamin with minerals  1 tablet Oral Daily  . nystatin   Topical TID  . pantoprazole  40 mg Oral Daily  . polyethylene glycol  17 g Oral Daily  . pravastatin  40 mg Oral QHS  . QUEtiapine  25 mg Oral QHS  . thiamine  100 mg Oral Daily  . warfarin  5 mg Oral ONCE-1800  . Warfarin - Pharmacist Dosing Inpatient   Does not apply q1800   Continuous Infusions:   PRN Meds: hydrOXYzine, loperamide, LORazepam, metoprolol, ondansetron (ZOFRAN) IV, ondansetron  Time spent: 25 minutes  Marlin Canary DO [phone]   11/06/2015 1:33 PM  If 7PM-7AM, please contact night-coverage at [email or web address], password [facility]

## 2015-11-07 LAB — PROTIME-INR
INR: 1.73 — AB (ref 0.00–1.49)
Prothrombin Time: 19.6 seconds — ABNORMAL HIGH (ref 11.6–15.2)

## 2015-11-07 MED ORDER — DIAZEPAM 2 MG PO TABS: 1.0000 mg | ORAL_TABLET | Freq: Two times a day (BID) | ORAL | Status: AC

## 2015-11-07 MED ORDER — QUETIAPINE FUMARATE 25 MG PO TABS: 25.0000 mg | ORAL_TABLET | Freq: Every day | ORAL | Status: AC

## 2015-11-07 MED ORDER — NYSTATIN 100000 UNIT/GM EX POWD: CUTANEOUS | Status: AC

## 2015-11-07 MED ORDER — ENSURE ENLIVE PO LIQD: 237.0000 mL | Freq: Two times a day (BID) | ORAL | Status: DC

## 2015-11-07 MED ORDER — LORAZEPAM 0.5 MG PO TABS: 0.5000 mg | ORAL_TABLET | Freq: Two times a day (BID) | ORAL | Status: AC | PRN

## 2015-11-07 MED ORDER — THIAMINE HCL 100 MG PO TABS: 100.0000 mg | ORAL_TABLET | Freq: Every day | ORAL | Status: AC

## 2015-11-07 MED ORDER — ADULT MULTIVITAMIN W/MINERALS CH: 1.0000 | ORAL_TABLET | Freq: Every day | ORAL | Status: AC

## 2015-11-07 MED ORDER — WARFARIN SODIUM 2.5 MG PO TABS
2.5000 mg | ORAL_TABLET | Freq: Once | ORAL | Status: AC
  Administered 2015-11-07: 2.5 mg via ORAL
  Filled 2015-11-07: qty 1

## 2015-11-07 MED ORDER — DILTIAZEM HCL 90 MG PO TABS: 90.0000 mg | ORAL_TABLET | Freq: Four times a day (QID) | ORAL | Status: DC

## 2015-11-07 NOTE — Care Management Note (Signed)
Case Management Note  Patient Details  Name: CADEL STAIRS MRN: [id number] Date of Birth: 1945/09/28  Subjective/Objective:                    Action/Plan:d/c SNF.   Expected Discharge Date:                  Expected Discharge Plan:  Skilled Nursing Facility  In-House Referral:  NA, Clinical Social Work  Discharge planning Services  CM Consult  Post Acute Care Choice:  NA Choice offered to:  NA  DME Arranged:    DME Agency:     HH Arranged:    HH Agency:     Status of Service:  Completed, signed off  Medicare Important Message Given:  Yes Date Medicare IM Given:    Medicare IM give by:    Date Additional Medicare IM Given:    Additional Medicare Important Message give by:     If discussed at Long Length of Stay Meetings, dates discussed:    Additional Comments:  Lanier Clam, RN 11/07/2015, 1:27 PM

## 2015-11-07 NOTE — Progress Notes (Signed)
Nutrition Follow-up  DOCUMENTATION CODES:   Morbid obesity  INTERVENTION:  - Continue Ensure Enlive BID - RD will continue to monitor for needs  NUTRITION DIAGNOSIS:   Unintentional weight loss related to lethargy/confusion as evidenced by percent weight loss. -ongoing  GOAL:   Patient will meet greater than or equal to 90% of their needs -met  MONITOR:   PO intake, Supplement acceptance, Labs, Weight trends, I & O's  ASSESSMENT:   70 y.o. male with a Past medical history as bellow who is brought from home via EMS to the emergency department due to a scrotal swelling and incontinence.   2/27 Per chart review, pt has been eating 75-100% mainly of meals since previous RD assessment. Pt's acute confusion has been improving, pt with underlying hx of dementia. Ensure Enlive order BID remains in place. Per chart review, pt has gained 10 lbs in the past 7 days; will need to continue to monitor weight trends.  Pt meeting needs with meals and supplements. Medications reviewed. No IVF or diuretics ordered at this time. Labs reviewed; Cl: 98 mmol/L.    2/20 - Pt confused when RD visited at bedside.  - Pt did not want to speak with RD at this time.  - Unable to obtain history at this time from patient.  - Suspect patient has not been eating well PTA d/t weight loss and Mg, K, Phos lab values at admission. - Per weight history, pt has lost 31 lb since October 2016 (10% wt loss x 4 months, significant for time frame). - Unable to perform NFPE at this time. Visual assessment noted no muscle or fat depletion.  Diet Order:  Diet Heart Room service appropriate?: Yes; Fluid consistency:: Thin  Skin:  Reviewed, no issues  Last BM:  2/27  Height:   Ht Readings from Last 1 Encounters:  10/30/15 5' 9" (1.753 m)    Weight:   Wt Readings from Last 1 Encounters:  11/03/15 287 lb 14.7 oz (130.6 kg)    Ideal Body Weight:  72.7 kg  BMI:  Body mass index is 42.5 kg/(m^2).  Estimated  Nutritional Needs:   Kcal:  2150-2350  Protein:  90-100g  Fluid:  2.1L/day  EDUCATION NEEDS:   No education needs identified at this time      , RD, LDN Inpatient Clinical Dietitian Pager # [phone] After hours/weekend pager # [phone]  

## 2015-11-07 NOTE — Progress Notes (Signed)
ANTICOAGULATION CONSULT NOTE - Follow up  Pharmacy Consult for Warfarin Indication: atrial fibrillation  No Known Allergies  Patient Measurements: Height:  (175.3 cm) Weight: 287 lb 14.7 oz (130.6 kg) IBW/kg (Calculated) : 70.7  Vital Signs: Temp: 98.1 F (36.7 C) (02/27 0851) Temp Source: Oral (02/27 0851) BP: 138/86 mmHg (02/27 0851) Pulse Rate: 75 (02/27 0851)  Labs:  Recent Labs  11/05/15 0535 11/06/15 0518 11/07/15 0502  HGB  --  13.8  --   HCT  --  41.8  --   PLT  --  204  --   LABPROT 20.0* 18.4* 19.6*  INR 1.77* 1.59* 1.73*  CREATININE  --  0.98  --     Estimated Creatinine Clearance: 92.6 mL/min (by C-G formula based on Cr of 0.98).  Medications:  Scheduled:  . acetaminophen  650 mg Oral 3 times per day  . allopurinol  300 mg Oral Daily  . diazepam  1 mg Oral BID  . diltiazem  90 mg Oral 4 times per day  . feeding supplement (ENSURE ENLIVE)  237 mL Oral BID BM  . LORazepam  1 mg Intravenous Once  . metoprolol tartrate  25 mg Oral 3 times per day  . multivitamin with minerals  1 tablet Oral Daily  . nystatin   Topical TID  . pantoprazole  40 mg Oral Daily  . polyethylene glycol  17 g Oral Daily  . pravastatin  40 mg Oral QHS  . QUEtiapine  25 mg Oral QHS  . thiamine  100 mg Oral Daily  . Warfarin - Pharmacist Dosing Inpatient   Does not apply q1800   Infusions:     Assessment: 63 yoM presented to [facility] ED on 2/19 with scrotal swelling, anxiety.  His PMH includes atrial fibrillation on chronic warfarin anticoagulation.  He is noted to have Afib with RVR.  Pharmacy is consulted to continue warfarin dosing inpatient.  Home warfarin dose is reported as 2.5 mg daily except  on Tuesday/Fridays.  Last dose was 2/18.  Admission INR is 1.8.  Today, 11/07/2015:  INR 1.73 remains subtherapeutic, but increasing.    Boosted warfarin doses given on 2/19-2/20, then INR became supratherapeutic and was held 2/22-2/23.  After holding, INR became  subtherapeutic and has been getting boosted doses again.  CBC: Hgb/Plt ok - no issues.   Diet: heart healthy; eating 50-100% of meals  No major drug-drug interactions noted  Goal of Therapy:  INR 2-3 Monitor platelets by anticoagulation protocol: Yes   Plan:   Warfarin 2.5 mg PO x 1 today  Daily INR and CBC  Lynann Beaver PharmD, BCPS Pager [phone] 11/07/2015 9:36 AM

## 2015-11-07 NOTE — Progress Notes (Signed)
Report called to Aram Beecham at [facility]. They are ready to accept patient.  Ardyth Gal, RN 11/07/2015

## 2015-11-07 NOTE — Discharge Summary (Signed)
Physician Discharge Summary  Ryan Barker AVW:[id number] DOB: [birth year] DOA: 10/30/2015  PCP: Thora Lance, MD  Admit date: 10/30/2015 Discharge date: 11/07/2015  Time spent: 35 minutes  Recommendations for Outpatient Follow-up:  1. Repeat TSH, free t4 as outpatient   Discharge Diagnoses:  Principal Problem:   Atrial fibrillation with rapid ventricular response (HCC) Active Problems:   Long term current use of anticoagulant therapy   Essential hypertension, benign   Dementia with behavioral disturbance   Depression due to dementia   Benzodiazepine withdrawal with perceptual disturbance (HCC)   Hypokalemia   Hypomagnesemia   Serotonin syndrome   GERD (gastroesophageal reflux disease)   Discharge Condition: improved  Diet recommendation: cardiac  Filed Weights   10/30/15 2200 11/03/15 0441  Weight: 125.7 kg (277 lb 1.9 oz) 130.6 kg (287 lb 14.7 oz)    History of present illness:  KNOX CERVI is a 70 y.o. male with a Past medical history as bellow who is brought from home via EMS to the emergency department due to a scrotal swelling and incontinence.   Per patient, he has a "hidden penis" and states that when he urinates his urine is staying in his scrotum instead flowing out, causing his scrotum to swell. The patient also has been hallucinating and stating that the police is near and that they are about to arrest him for insurance fraud and charge him with a felony. His wife states that he was recently tapered off very quickly from alprazolam, which he has been using for anxiety for the past 20 years. She is states that he has been increasingly confused and delusional. He also had not taken his metoprolol and Cardizem since yesterday and was found to be in atrial fibrillation with RVR.   Workup in the emergency department is significant for hypokalemia, hypomagnesemia, hypophosphatemia and mild lactic acidosis.  Hospital Course:  1. Atrial fibrillation with rapid  ventricular response (HCC) Known prior history of A. fib with chronic anticoagulation- coumadin Echocardiogram done TSH outpatient Cardizem increased Continue Lopressor 25 mg twice a day.  2. Acute encephalopathy with psychosis and hallucinations. Suspected benzodiazepine withdrawal Suspected serotonin syndrome The patient was having significant visual/auditory hallucination--- has lessened in the last 24 hours  -Differential diagnoses include benzodiazepine withdrawal since the patient has been on Xanax 1 mg every 8 hours as needed for more than 20 years, and recently has been tapered off due to memory issue. Patient was also recently started on Celexa, one week ago, and his presentation can also be explained by serotonin syndrome. Regardless of treatment is similar. scheduled Valium every 12 hours Prn ativan Discontinue Celexa. Psych consult appreciated- Does not meet criteria for inpt psych  3. Dementia with depression. Currently holding Celexa. Continue benzodiazepine- decrease valium  4. Hypokalemia, hypomagnesemia, lactic acidosis. All more likely secondary to dehydration. continue to monitor.  5. essential hypertension. Resume home meds-- mainly rate controlling  6.dyslipidemia. Continuing statin.  7.GERD. PPI  Procedures:    Consultations:  psych  Discharge Exam: Filed Vitals:   11/07/15 0851 11/07/15 1235  BP: 138/86 154/87  Pulse: 75 65  Temp: 98.1 F (36.7 C)   Resp: 22     General: awake, NAD   Discharge Instructions   Discharge Instructions    Diet - low sodium heart healthy    Complete by:  As directed      Discharge instructions    Complete by:  As directed   Outpatient psych follow up for depression  Increase activity slowly    Complete by:  As directed           Current Discharge Medication List    START taking these medications   Details  diazepam (VALIUM) 2 MG tablet Take 0.5 tablets (1 mg total) by mouth 2 (two) times  daily. Qty: 30 tablet, Refills: 0    feeding supplement, ENSURE ENLIVE, (ENSURE ENLIVE) LIQD Take 237 mLs by mouth 2 (two) times daily between meals. Qty: 237 mL, Refills: 12    LORazepam (ATIVAN) 0.5 MG tablet Take 1 tablet (0.5 mg total) by mouth every 12 (twelve) hours as needed for anxiety. Qty: 15 tablet, Refills: 0    Multiple Vitamin (MULTIVITAMIN WITH MINERALS) TABS tablet Take 1 tablet by mouth daily.    nystatin (MYCOSTATIN/NYSTOP) 100000 UNIT/GM POWD Apply to groin Refills: 0    QUEtiapine (SEROQUEL) 25 MG tablet Take 1 tablet (25 mg total) by mouth at bedtime.    thiamine 100 MG tablet Take 1 tablet (100 mg total) by mouth daily.      CONTINUE these medications which have CHANGED   Details  diltiazem (CARDIZEM) 90 MG tablet Take 1 tablet (90 mg total) by mouth every 6 (six) hours.      CONTINUE these medications which have NOT CHANGED   Details  acetaminophen (TYLENOL) 650 MG CR tablet Take 1,300 mg by mouth every 8 (eight) hours as needed for pain.    allopurinol (ZYLOPRIM) 300 MG tablet Take 300 mg by mouth daily.    lisinopril (PRINIVIL,ZESTRIL) 40 MG tablet Take 40 mg by mouth daily.    metoprolol tartrate (LOPRESSOR) 25 MG tablet Take 25 mg by mouth 2 (two) times daily.    omeprazole (PRILOSEC) 40 MG capsule Take 40 mg by mouth daily.    pravastatin (PRAVACHOL) 40 MG tablet Take 40 mg by mouth daily.    warfarin (COUMADIN) 5 MG tablet Take 2.5-5 mg by mouth daily. Take 5mg  ( 1 tablet) on Tuesdays and Fridays . Then take 2.5mg  ( one-half ) of a tablet on Monday, Wednesday, Thursday , Saturday and Sunday      STOP taking these medications     citalopram (CELEXA) 10 MG tablet      meloxicam (MOBIC) 15 MG tablet      torsemide (DEMADEX) 20 MG tablet        No Known Allergies Follow-up Information    Follow up with [facility].   Specialty:  Skilled Nursing Facility   Contact information:   [address] [place]  [place] [address] [phone]      Follow up with Thora Lance, MD In 1 week.   Specialty:  Family Medicine   Contact information:   [address] [place] [address] [phone]        The results of significant diagnostics from this hospitalization (including imaging, microbiology, ancillary and laboratory) are listed below for reference.    Significant Diagnostic Studies: Ct Head Wo Contrast  11/01/2015  CLINICAL DATA:  Initial evaluation for acute encephalopathy, hallucinations. EXAM: CT HEAD WITHOUT CONTRAST TECHNIQUE: Contiguous axial images were obtained from the base of the skull through the vertex without intravenous contrast. COMPARISON:  None. FINDINGS: Study degraded by motion artifact. Diffuse prominence of the CSF containing spaces compatible with generalized cerebral atrophy. Mild chronic small vessel ischemic type changes present within the periventricular deep white matter both cerebral hemispheres. No acute large vessel territory infarct. No acute intracranial hemorrhage. No mass  lesion, midline shift, or mass effect. No hydrocephalus. No extra-axial fluid collection. Scalp soft tissues within normal limits. No acute abnormality about the orbits. Sequela prior lens extraction noted on the right. Paranasal sinuses are largely clear.  No mastoid effusion. Calvarium intact. IMPRESSION: 1. No acute intracranial process. 2. 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 Electronically Signed   By: Rise Mu M.D.   On: 11/01/2015 05:19   Ct Abdomen Pelvis W Contrast  10/30/2015  CLINICAL DATA:  Mole a ease since last night EXAM: CT ABDOMEN AND PELVIS WITH CONTRAST TECHNIQUE: Multidetector CT imaging of the abdomen and pelvis was performed using the standard protocol following bolus administration of intravenous contrast. CONTRAST:  50mL OMNIPAQUE IOHEXOL 300 MG/ML SOLN, OMNIPAQUE IOHEXOL 300 MG/ML SOLN  COMPARISON:  12/06/2006 FINDINGS: Minimal dependent atelectasis. Liver, spleen, and adrenal glands are within normal limits. Nonspecific calcifications in the pancreatic body are slightly more prominent. There is no associated mass. This likely is related to chronic pancreatitis. Gallstones are present. The gallbladder is not distended but there is some thickening of the gallbladder wall. Several hypodensities are scattered throughout the kidneys. The larger ones are characterized as simple cysts. Small ones are too small to characterize. These are more prominent than on the prior study from 9 years prior. Indeterminate complex lesion deep to the cecum is smaller supporting benign etiology. Bladder and prostate are within normal limits. No free-fluid.  No abnormal adenopathy. There are calcifications within the left renal vein. 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 finding. Collateral venous structures are seen extending to the splenic vein. No vertebral compression deformity. Advanced degenerative changes in the lumbar spine. IMPRESSION: Gallstones. There is nonspecific gallbladder wall thickening. The gallbladder is nondistended. Ultrasound can be performed as clinically indicated. Chronic changes. Electronically Signed   By: Jolaine Click M.D.   On: 10/30/2015 08:36   Dg Chest Port 1 View  10/31/2015  CLINICAL DATA:  Weight loss and shortness of Breath EXAM: PORTABLE CHEST 1 VIEW COMPARISON:  12/07/2006 FINDINGS: Cardiac shadow is enlarged. The lungs are well aerated bilaterally. No focal infiltrate or sizable effusion is seen. Degenerative changes of the shoulder joints left greater than right are seen. IMPRESSION: No acute abnormality noted. Electronically Signed   By: Alcide Clever M.D.   On: 10/31/2015 16:49    Microbiology: Recent Results (from the past 240 hour(s))  Urine culture     Status: None   Collection Time: 10/30/15  5:48 AM  Result Value Ref Range  Status   Specimen Description URINE, CLEAN CATCH  Final   Special Requests NONE  Final   Culture   Final    MULTIPLE SPECIES PRESENT, SUGGEST RECOLLECTION Performed at [facility]    Report Status 10/31/2015 FINAL  Final  MRSA PCR Screening     Status: None   Collection Time: 10/30/15  9:13 PM  Result Value Ref Range Status   MRSA by PCR NEGATIVE NEGATIVE Final    Comment:        The GeneXpert MRSA Assay (FDA approved for NASAL specimens only), is one component of a comprehensive MRSA colonization surveillance program. It is not intended to diagnose MRSA infection nor to guide or monitor treatment for MRSA infections.      Labs: Basic Metabolic Panel:  Recent Labs Lab 10/31/15 1712 11/01/15 0320 11/02/15 0350 11/03/15 0337 11/04/15 0535 11/06/15 0518  NA 142 140 139 140 135 137  K 3.8 3.4* 3.8 4.1 4.3 3.9  CL 101 100* 101 103 98* 98*  CO2 GLUCOSE 126* 111* 104* 99 105* 104*  BUN 21* 18 20 22* 20 21*  CREATININE 1.22 1.01 0.89 0.91 0.89 0.98  CALCIUM 9.0 8.5* 8.5* 8.6* 9.0 8.9  MG 1.7 1.6* 1.8  --   --   --    Liver Function Tests: No results for input(s): AST, ALT, ALKPHOS, BILITOT, PROT, ALBUMIN in the last 168 hours. No results for input(s): LIPASE, AMYLASE in the last 168 hours. No results for input(s): AMMONIA in the last 168 hours. CBC:  Recent Labs Lab 11/01/15 0320 11/02/15 0350 11/03/15 0337 11/04/15 0535 11/06/15 0518  WBC 6.2 6.4 6.4 7.7 8.9  HGB 13.8 13.6 12.9* 13.6 13.8  HCT 41.8 41.7 38.8* 42.1 41.8  MCV 101.5* 102.0* 100.8* 101.0* 101.0*  PLT 207 191 170 212 204   Cardiac Enzymes: No results for input(s): CKTOTAL, CKMB, CKMBINDEX, TROPONINI in the last 168 hours. BNP: BNP (last 3 results) No results for input(s): BNP in the last 8760 hours.  ProBNP (last 3 results) No results for input(s): PROBNP in the last 8760 hours.  CBG: No results for input(s): GLUCAP in the last 168  hours.     Signed:  Joquan Lotz U Salvadore Valvano DO.  [facility] 11/07/2015, 1:19 PM

## 2015-11-07 NOTE — Clinical Social Work Placement (Signed)
Patient is set to discharge to [facility] today. Patient & wife at bedside aware. Discharge packet given to RN, Verlon Au. [facility] called for transport.     Lincoln Maxin, LCSW [facility] Clinical Social Worker cell #: [phone]    CLINICAL SOCIAL WORK PLACEMENT  NOTE  Date:  11/07/2015  Patient Details  Name: Ryan Barker MRN: [id number] Date of Birth: April 16, 1946  Clinical Social Work is seeking post-discharge placement for this patient at the Skilled  Nursing Facility level of care (*CSW will initial, date and re-position this form in  chart as items are completed):  Yes   Patient/family provided with [facility] Clinical Social Work Department's list of facilities offering this level of care within the geographic area requested by the patient (or if unable, by the patient's family).  Yes   Patient/family informed of their freedom to choose among providers that offer the needed level of care, that participate in Medicare, Medicaid or managed care program needed by the patient, have an available bed and are willing to accept the patient.  Yes   Patient/family informed of [facility] ownership interest in [facility] and [facility], as well as of the fact that they are under no obligation to receive care at these facilities.  PASRR submitted to EDS on       PASRR number received on       Existing PASRR number confirmed on 11/04/15     FL2 transmitted to all facilities in geographic area requested by pt/family on 11/04/15     FL2 transmitted to all facilities within larger geographic area on       Patient informed that his/her managed care company has contracts with or will negotiate with certain facilities, including the following:        Yes   Patient/family informed of bed offers received.  Patient chooses bed at [facility]     Physician recommends and patient chooses bed at      Patient to be transferred to [facility] on 11/07/15.  Patient to be transferred to facility by [facility]     Patient family notified on 11/07/15 of transfer.  Name of family member notified:  patient's wife at bedside     PHYSICIAN       Additional Comment:    _______________________________________________ Arlyss Repress, LCSW 11/07/2015, 2:42 PM

## 2015-11-09 ENCOUNTER — Non-Acute Institutional Stay (SKILLED_NURSING_FACILITY): Payer: Medicare Other | Admitting: Internal Medicine

## 2015-11-09 ENCOUNTER — Encounter: Payer: Self-pay | Admitting: Internal Medicine

## 2015-11-09 DIAGNOSIS — F0280 Dementia in other diseases classified elsewhere without behavioral disturbance: Secondary | ICD-10-CM | POA: Diagnosis not present

## 2015-11-09 DIAGNOSIS — I4891 Unspecified atrial fibrillation: Secondary | ICD-10-CM

## 2015-11-09 DIAGNOSIS — E876 Hypokalemia: Secondary | ICD-10-CM | POA: Diagnosis not present

## 2015-11-09 DIAGNOSIS — F0393 Unspecified dementia, unspecified severity, with mood disturbance: Secondary | ICD-10-CM

## 2015-11-09 DIAGNOSIS — E785 Hyperlipidemia, unspecified: Secondary | ICD-10-CM

## 2015-11-09 DIAGNOSIS — F13932 Sedative, hypnotic or anxiolytic use, unspecified with withdrawal with perceptual disturbances: Secondary | ICD-10-CM

## 2015-11-09 DIAGNOSIS — K219 Gastro-esophageal reflux disease without esophagitis: Secondary | ICD-10-CM | POA: Diagnosis not present

## 2015-11-09 DIAGNOSIS — F329 Major depressive disorder, single episode, unspecified: Secondary | ICD-10-CM | POA: Diagnosis not present

## 2015-11-09 DIAGNOSIS — F13232 Sedative, hypnotic or anxiolytic dependence with withdrawal with perceptual disturbance: Secondary | ICD-10-CM | POA: Diagnosis not present

## 2015-11-09 NOTE — Progress Notes (Deleted)
Provider:   Location:  [facility]   Place of Service:  SNF (31)  PCP: Thora Lance, MD Patient Care Team: Blair Heys, MD as PCP - General (Family Medicine)  Extended Emergency Contact Information Primary Emergency Contact: [facility] Address: [address]           [address] [place] Home Phone: [phone] Mobile Phone: [phone] Relation: Spouse  Code Status: Full Code Goals of Care: Advanced Directive information Advanced Directives 11/09/2015  Does patient have an advance directive? Yes  Type of Advance Directive Healthcare Power of Attorney  Does patient want to make changes to advanced directive? No - Patient declined  Copy of advanced directive(s) in chart? Yes      Chief Complaint  Patient presents with  . New Admit To SNF    HPI: Patient is a 70 y.o. male seen today for admission to  Past Medical History  Diagnosis Date  . Gout   . HTN (hypertension)   . Depressive disorder   . Atrial fibrillation (HCC)   . Osteoarthritis   . Obesity   . GERD (gastroesophageal reflux disease)   . Hypercholesteremia   . Carpal tunnel syndrome   . Legally blind in left eye, as defined in [place]   . Hypercholesteremia   . 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  They did surgery on that 15 yrs ago."   Past Surgical History  Procedure Laterality Date  . Tonsillectomy    . Inguinal hernia repair      reports that he has never smoked. He does not have any smokeless tobacco history on file. He reports that he does not drink alcohol or use illicit drugs. Social History   Social History  . Marital Status: Married    Spouse Name: N/A  . Number of Children: N/A  . Years of Education: N/A   Occupational History  . Not on file.   Social History Main Topics  . Smoking status: Never Smoker   . Smokeless tobacco: Not on file  . Alcohol Use: No  . Drug Use: No  . Sexual  Activity: Not on file   Other Topics Concern  . Not on file   Social History Narrative    Functional Status Survey:    No family history on file.  Health Maintenance  Topic Date Due  . Hepatitis C Screening  1946-03-25  . TETANUS/TDAP  12/29/1962  . COLONOSCOPY  12/28/1993  . ZOSTAVAX  12/29/2003  . PNA vac Low Risk Adult (1 of 2 - PCV13) 12/28/2008  . INFLUENZA VACCINE  04/11/2015    No Known Allergies    Medication List       This list is accurate as of: 11/09/15 12:54 PM.  Always use your most recent med list.               acetaminophen 650 MG CR tablet  Commonly known as:  TYLENOL  Take 1,300 mg by mouth every 8 (eight) hours as needed for pain.     allopurinol 300 MG tablet  Commonly known as:  ZYLOPRIM  Take 300 mg by mouth daily.     diazepam 2 MG tablet  Commonly known as:  VALIUM  Take 0.5 tablets (1 mg total) by mouth 2 (two) times daily.     diltiazem 90 MG tablet  Commonly known as:  CARDIZEM  Take 1 tablet (90 mg total) by mouth  every 6 (six) hours.     feeding supplement (ENSURE ENLIVE) Liqd  Take 237 mLs by mouth 2 (two) times daily between meals.     lisinopril 40 MG tablet  Commonly known as:  PRINIVIL,ZESTRIL  Take 40 mg by mouth daily.     LORazepam 0.5 MG tablet  Commonly known as:  ATIVAN  Take 1 tablet (0.5 mg total) by mouth every 12 (twelve) hours as needed for anxiety.     metoprolol tartrate 25 MG tablet  Commonly known as:  LOPRESSOR  Take 25 mg by mouth 2 (two) times daily.     multivitamin with minerals Tabs tablet  Take 1 tablet by mouth daily.     nystatin 100000 UNIT/GM Powd  Apply to groin     omeprazole 40 MG capsule  Commonly known as:  PRILOSEC  Take 40 mg by mouth daily.     pravastatin 40 MG tablet  Commonly known as:  PRAVACHOL  Take 40 mg by mouth daily.     QUEtiapine 25 MG tablet  Commonly known as:  SEROQUEL  Take 1 tablet (25 mg total) by mouth at bedtime.     thiamine 100 MG tablet  Take 1  tablet (100 mg total) by mouth daily.     warfarin 5 MG tablet  Commonly known as:  COUMADIN  Take 2.5-5 mg by mouth daily. Take 5mg  ( 1 tablet) on Tuesdays and Fridays . Then take 2.5mg  ( one-half ) of a tablet on Monday, Wednesday, Thursday , Saturday and Sunday        Review of Systems  Filed Vitals:   11/09/15 1238  BP: 132/80  Pulse: 89  Temp: 98.4 F (36.9 C)  TempSrc: Oral  Resp: 20  Height: 5\' 9"  (1.753 m)  Weight: 287 lb 14.7 oz (130.6 kg)   Body mass index is 42.5 kg/(m^2). Physical Exam  Labs reviewed: Basic Metabolic Panel:  Recent Labs  16/10/96 0539  10/31/15 1712 11/01/15 0320 11/02/15 0350 11/03/15 0337 11/04/15 0535 11/06/15 0518  NA 135  < > 142 140 139 140 135 137  K 2.9*  < > 3.8 3.4* 3.8 4.1 4.3 3.9  CL 96*  < > 101 100* 101 103 98* 98*  CO2 26  < > 27 30 28 29 29 27   GLUCOSE 130*  < > 126* 111* 104* 99 105* 104*  BUN 16  < > 21* 18 20 22* 20 21*  CREATININE 0.97  < > 1.22 1.01 0.89 0.91 0.89 0.98  CALCIUM 9.0  < > 9.0 8.5* 8.5* 8.6* 9.0 8.9  MG 1.3*  --  1.7 1.6* 1.8  --   --   --   PHOS 2.0*  --   --   --   --   --   --   --   < > = values in this interval not displayed. Liver Function Tests:  Recent Labs  10/23/15 1837 10/30/15 0539 10/31/15 0134  AST 22 26 23   ALT 10* 11* 12*  ALKPHOS 90 93 83  BILITOT 1.3* 0.8 1.1  PROT 7.1 7.5 6.9  ALBUMIN 4.2 4.5 4.2   No results for input(s): LIPASE, AMYLASE in the last 8760 hours. No results for input(s): AMMONIA in the last 8760 hours. CBC:  Recent Labs  10/23/15 1837 10/30/15 0539  10/31/15 0134  11/03/15 0337 11/04/15 0535 11/06/15 0518  WBC 7.4 8.8  --  10.0  < > 6.4 7.7 8.9  NEUTROABS 5.4 6.8  --  6.8  --   --   --   --   HGB 14.4 15.6  < > 14.7  < > 12.9* 13.6 13.8  HCT 42.7 45.7  < > 43.0  < > 38.8* 42.1 41.8  MCV 96.6 97.4  --  99.5  < > 100.8* 101.0* 101.0*  PLT 207 253  --  251  < > 170 212 204  < > = values in this interval not displayed. Cardiac  Enzymes:  Recent Labs  10/30/15 2036 10/31/15 0131  TROPONINI 0.06* 0.06*   BNP: Invalid input(s): POCBNP No results found for: HGBA1C Lab Results  Component Value Date   TSH 1.259 10/31/2015   No results found for: VITAMINB12 No results found for: FOLATE No results found for: IRON, TIBC, FERRITIN  Imaging and Procedures obtained prior to SNF admission: Ct Abdomen Pelvis W Contrast  10/30/2015  CLINICAL DATA:  Mole a ease since last night EXAM: CT ABDOMEN AND PELVIS WITH CONTRAST TECHNIQUE: Multidetector CT imaging of the abdomen and pelvis was performed using the standard protocol following bolus administration of intravenous contrast. CONTRAST:  50mL OMNIPAQUE IOHEXOL 300 MG/ML SOLN, OMNIPAQUE IOHEXOL 300 MG/ML SOLN COMPARISON:  12/06/2006 FINDINGS: Minimal dependent atelectasis. Liver, spleen, and adrenal glands are within normal limits. Nonspecific calcifications in the pancreatic body are slightly more prominent. There is no associated mass. This likely is related to chronic pancreatitis. Gallstones are present. The gallbladder is not distended but there is some thickening of the gallbladder wall. Several hypodensities are scattered throughout the kidneys. The larger ones are characterized as simple cysts. Small ones are too small to characterize. These are more prominent than on the prior study from 9 years prior. Indeterminate complex lesion deep to the cecum is smaller supporting benign etiology. Bladder and prostate are within normal limits. No free-fluid.  No abnormal adenopathy. There are calcifications within the left renal vein. 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 finding. Collateral venous structures are seen extending to the splenic vein. No vertebral compression deformity. Advanced degenerative changes in the lumbar spine. IMPRESSION: Gallstones. There is nonspecific gallbladder wall thickening. The gallbladder is nondistended.  Ultrasound can be performed as clinically indicated. Chronic changes. Electronically Signed   By: Jolaine Click M.D.   On: 10/30/2015 08:36   Dg Chest Port 1 View  10/31/2015  CLINICAL DATA:  Weight loss and shortness of Breath EXAM: PORTABLE CHEST 1 VIEW COMPARISON:  12/07/2006 FINDINGS: Cardiac shadow is enlarged. The lungs are well aerated bilaterally. No focal infiltrate or sizable effusion is seen. Degenerative changes of the shoulder joints left greater than right are seen. IMPRESSION: No acute abnormality noted. Electronically Signed   By: Alcide Clever M.D.   On: 10/31/2015 16:49    Assessment/Plan There are no diagnoses linked to this encounter.   Family/ staff Communication:   Labs/tests ordered:

## 2015-11-11 LAB — CBC AND DIFFERENTIAL
HEMATOCRIT: 40 % — AB (ref 41–53)
Hemoglobin: 13.5 g/dL (ref 13.5–17.5)
PLATELETS: 220 10*3/uL (ref 150–399)
WBC: 9.2 10*3/mL

## 2015-11-11 LAB — BASIC METABOLIC PANEL
BUN: 21 mg/dL (ref 4–21)
CREATININE: 0.9 mg/dL (ref 0.6–1.3)
Glucose: 107 mg/dL
Potassium: 3.6 mmol/L (ref 3.4–5.3)
Sodium: 134 mmol/L — AB (ref 137–147)

## 2015-11-11 LAB — TSH: TSH: 2.56 u[IU]/mL (ref 0.41–5.90)

## 2015-11-12 ENCOUNTER — Encounter: Payer: Self-pay | Admitting: Internal Medicine

## 2015-11-12 NOTE — Assessment & Plan Note (Signed)
SNF - Not stated as uncontrolled; cont pravachol 40 mg daily

## 2015-11-12 NOTE — Progress Notes (Signed)
MRN: [id number] Name: Ryan Barker  Sex: male Age: 70 y.o. DOB: Aug 05, 1946  PSC #: [facility] Facility/Room:108 Level Of Care: SNF Provider: Merrilee Seashore D Emergency Contacts: Extended Emergency Contact Information Primary Emergency Contact: [facility] Address: [address]          [place] [address] [place] Home Phone: [phone] Mobile Phone: [phone] Relation: Spouse  Code Status:   Allergies: Review of patient's allergies indicates no known allergies.  Chief Complaint  Patient presents with  . New Admit To SNF    HPI: Patient is 70 y.o. male whowith a Past medical history as bellow who is brought from home via EMS to the emergency department due to a scrotal swelling and incontinence. Per patient, he has a "hidden penis" and states that when he urinates his urine is staying in his scrotum instead flowing out, causing his scrotum to swell. The patient also has been hallucinating and stating that the police is near and that they are about to arrest him for insurance fraud and charge him with a felony. His wife states that he was recently tapered off very quickly from alprazolam, which he has been using for anxiety for the past 20 years. She is states that he has been increasingly confused and delusional. He also had not taken his metoprolol and Cardizem since yesterday and was found to be in atrial fibrillation with RVR. Pt was admitted to [facility] from 2/19-27 where he was treated for AF with RVR, benzodiazepine withdrawal and multiple electrolyte abnormalities.Pt is admitted to SNF with generalized weakness for OT/PT. While at SNF pt will be followed for HTN, tx with cardizem, lisinopril and lopressor, HLD, tx with pravachol, and GERD , tx with prilosec.  Past Medical History  Diagnosis Date  . Gout   . HTN (hypertension)   . Depressive disorder   . Atrial fibrillation (HCC)   . Osteoarthritis   . Obesity   . GERD (gastroesophageal reflux disease)    . Hypercholesteremia   . Carpal tunnel syndrome   . Legally blind in left eye, as defined in [place]   . Hypercholesteremia   . 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  They did surgery on that 15 yrs ago."    Past Surgical History  Procedure Laterality Date  . Tonsillectomy    . Inguinal hernia repair        Medication List       This list is accurate as of: 11/09/15 11:59 PM.  Always use your most recent med list.               acetaminophen 650 MG CR tablet  Commonly known as:  TYLENOL  Take 1,300 mg by mouth every 8 (eight) hours as needed for pain.     allopurinol 300 MG tablet  Commonly known as:  ZYLOPRIM  Take 300 mg by mouth daily.     diazepam 2 MG tablet  Commonly known as:  VALIUM  Take 0.5 tablets (1 mg total) by mouth 2 (two) times daily.     diltiazem 90 MG tablet  Commonly known as:  CARDIZEM  Take 1 tablet (90 mg total) by mouth every 6 (six) hours.     feeding supplement (ENSURE ENLIVE) Liqd  Take 237 mLs by mouth 2 (two) times daily between meals.     lisinopril 40 MG tablet  Commonly known as:  PRINIVIL,ZESTRIL  Take 40 mg by mouth daily.  LORazepam 0.5 MG tablet  Commonly known as:  ATIVAN  Take 1 tablet (0.5 mg total) by mouth every 12 (twelve) hours as needed for anxiety.     metoprolol tartrate 25 MG tablet  Commonly known as:  LOPRESSOR  Take 25 mg by mouth 2 (two) times daily.     multivitamin with minerals Tabs tablet  Take 1 tablet by mouth daily.     nystatin 100000 UNIT/GM Powd  Apply to groin     omeprazole 40 MG capsule  Commonly known as:  PRILOSEC  Take 40 mg by mouth daily.     pravastatin 40 MG tablet  Commonly known as:  PRAVACHOL  Take 40 mg by mouth daily.     QUEtiapine 25 MG tablet  Commonly known as:  SEROQUEL  Take 1 tablet (25 mg total) by mouth at bedtime.     thiamine 100 MG tablet  Take 1 tablet (100 mg total) by mouth daily.     warfarin 5 MG tablet  Commonly  known as:  COUMADIN  Take 2.5-5 mg by mouth daily. Take 5mg  ( 1 tablet) on Tuesdays and Fridays . Then take 2.5mg  ( one-half ) of a tablet on Monday, Wednesday, Thursday , Saturday and Sunday        No orders of the defined types were placed in this encounter.     There is no immunization history on file for this patient.  Social History  Substance Use Topics  . Smoking status: Never Smoker   . Smokeless tobacco: Not on file  . Alcohol Use: No    Family history is + CA  Review of Systems  DATA OBTAINED: from patient, nurse, pt's wife GENERAL:  no fevers, fatigue, appetite changes SKIN: No itching, rash or wounds EYES: No eye pain, redness, discharge EARS: No earache, tinnitus, change in hearing NOSE: No congestion, drainage or bleeding  MOUTH/THROAT: No mouth or tooth pain, No sore throat RESPIRATORY: No cough, wheezing, SOB CARDIAC: No chest pain, palpitations, lower extremity edema  GI: No abdominal pain, No N/V/D or constipation, No heartburn or reflux  GU: No dysuria, frequency or urgency, or incontinence  MUSCULOSKELETAL: No unrelieved bone/joint pain NEUROLOGIC: No headache, dizziness or focal weakness PSYCHIATRIC: still having auditory hallucinations, were not going on before pt was in hospital  Filed Vitals:   11/12/15 1453  BP: 132/80  Pulse: 89  Temp: 98.4 F (36.9 C)  Resp: 20    SpO2 Readings from Last 1 Encounters:  11/07/15 100%        Physical Exam  GENERAL APPEARANCE: Alert, conversant,  No acute distress.  SKIN: No diaphoresis rash HEAD: Normocephalic, atraumatic  EYES: Conjunctiva/lids clear. Pupils round, reactive. EOMs intact.  EARS: External exam WNL, canals clear. Hearing grossly normal.  NOSE: No deformity or discharge.  MOUTH/THROAT: Lips w/o lesions  RESPIRATORY: Breathing is even, unlabored. Lung sounds are clear   CARDIOVASCULAR: Heart RRR no murmurs, rubs or gallops. trace peripheral edema.   GASTROINTESTINAL: Abdomen is  soft, non-tender, not distended w/ normal bowel sounds. GENITOURINARY: Bladder non tender, not distended  MUSCULOSKELETAL: No abnormal joints or musculature NEUROLOGIC:  Cranial nerves 2-12 grossly intact; L side weakness PSYCHIATRIC: Mood and affect appropriate to situation, no behavioral issues  Patient Active Problem List   Diagnosis Date Noted  . Serotonin syndrome 10/31/2015  . GERD (gastroesophageal reflux disease) 10/31/2015  . Atrial fibrillation with rapid ventricular response (HCC) 10/30/2015  . 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 Hypokalemia  10/30/2015  . Atrial fibrillation with RVR (HCC) 10/30/2015  . Hypomagnesemia 10/30/2015  . Dementia with behavioral disturbance 10/24/2015  . 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 Alteration in self-care ability   . Chronic atrial fibrillation (HCC) 06/24/2013    Class: Chronic  . Long term current use of anticoagulant therapy 06/24/2013  . Hyperlipidemia 06/24/2013  . Essential hypertension, benign 06/24/2013    CBC    Component Value Date/Time   WBC 8.9 11/06/2015 0518   WBC 7.4 10/23/2015   RBC 4.14* 11/06/2015 0518   HGB 13.8 11/06/2015 0518   HCT 41.8 11/06/2015 0518   PLT 204 11/06/2015 0518   MCV 101.0* 11/06/2015 0518   LYMPHSABS 1.8 10/31/2015 0134   MONOABS 1.4* 10/31/2015 0134   EOSABS 0.1 10/31/2015 0134   BASOSABS 0.0 10/31/2015 0134    CMP     Component Value Date/Time   NA 137 11/06/2015 0518   NA 140 10/23/2015   K 3.9 11/06/2015 0518   CL 98* 11/06/2015 0518   CO2 27 11/06/2015 0518   GLUCOSE 104* 11/06/2015 0518   BUN 21* 11/06/2015 0518   BUN 21 10/23/2015   CREATININE 0.98 11/06/2015 0518   CREATININE 1.3 10/23/2015   CALCIUM 8.9 11/06/2015 0518   PROT 6.9 10/31/2015 0134   ALBUMIN 4.2 10/31/2015 0134   AST 23 10/31/2015 0134   ALT 12* 10/31/2015 0134   ALKPHOS 83 10/31/2015 0134   BILITOT 1.1 10/31/2015 0134   GFRNONAA >60 11/06/2015 0518    GFRAA >60 11/06/2015 0518    No results found for: HGBA1C   Ct Abdomen Pelvis W Contrast  10/30/2015  CLINICAL DATA:  Mole a ease since last night EXAM: CT ABDOMEN AND PELVIS WITH CONTRAST TECHNIQUE: Multidetector CT imaging of the abdomen and pelvis was performed using the standard protocol following bolus administration of intravenous contrast. CONTRAST:  50mL OMNIPAQUE IOHEXOL 300 MG/ML SOLN, 100mL OMNIPAQUE IOHEXOL 300 MG/ML SOLN COMPARISON:  12/06/2006 FINDINGS: Minimal dependent atelectasis. Liver, spleen, and adrenal glands are within normal limits. Nonspecific calcifications in the pancreatic body are slightly more prominent. There is no associated mass. This likely is related to chronic pancreatitis. Gallstones are present. The gallbladder is not distended but there is some thickening of the gallbladder wall. Several hypodensities are scattered throughout the kidneys. The larger ones are characterized as simple cysts. Small ones are too small to characterize. These are more prominent than on the prior study from 9 years prior. Indeterminate complex lesion deep to the cecum is smaller supporting benign etiology. Bladder and prostate are within normal limits. No free-fluid.  No abnormal adenopathy. There are calcifications within the left renal vein. 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 finding. Collateral venous structures are seen extending to the splenic vein. No vertebral compression deformity. Advanced degenerative changes in the lumbar spine. IMPRESSION: Gallstones. There is nonspecific gallbladder wall thickening. The gallbladder is nondistended. Ultrasound can be performed as clinically indicated. Chronic changes. Electronically Signed   By: Jolaine ClickArthur  Hoss M.D.   On: 10/30/2015 08:36   Dg Chest Port 1 View  10/31/2015  CLINICAL DATA:  Weight loss and shortness of Breath EXAM: PORTABLE CHEST 1 VIEW COMPARISON:  12/07/2006 FINDINGS: Cardiac shadow is  enlarged. The lungs are well aerated bilaterally. No focal infiltrate or sizable effusion is seen. Degenerative changes of the shoulder joints left greater than right are seen. IMPRESSION: No acute abnormality noted. Electronically Signed   By: Eulah PontMark  Lukens M.D.  On: 10/31/2015 16:49    Not all labs, radiology exams or other studies done during hospitalization come through on my EPIC note; however they are reviewed by me.    Assessment and Plan  Atrial fibrillation with RVR (HCC) Known prior history of A. fib with chronic anticoagulation- coumadin Echocardiogram done TSH outpatient Cardizem increased SNF - Continue Lopressor 25 mg twice a day.and cont cardizem 90 mg q6 for rate it coumadin as prophylaxis-will be actively titrated  Benzodiazepine withdrawal with perceptual disturbance (HCC) The patient was having significant visual/auditory hallucination--- has lessened in the last 24 hours  -Differential diagnoses include benzodiazepine withdrawal since the patient has been on Xanax 1 mg every 8 hours as needed for more than 20 years, and recently has been tapered off due to memory issue. Patient was also recently started on Celexa, one week ago, and his presentation can also be explained by serotonin syndrome. Regardless of treatment is similar. scheduled Valium every 12 hours Prn ativan Discontinue Celexa. Psych consult appreciated- Does not meet criteria for inpt psych SNF - still having hallucinations, auditory - have increased his seroquel to 50 mg q HS  Depression due to dementia Currently holding Celexa. Continue benzodiazepine- decrease valium  Hypokalemia 2/2 dehydration; SNF - will follow with BMP  Hypomagnesemia 2/2 dehydraton; SNF - will follow with Mg+ level  Hyperlipidemia SNF - Not stated as uncontrolled; cont pravachol 40 mg daily  GERD (gastroesophageal reflux disease) SNF - not stated as uncontrolled; cont prilosec 40 mg daily   Time spent > 45 min;>  50% of time with patient was spent reviewing records, labs, tests and studies, counseling and developing plan of care  Margit Hanks, MD

## 2015-11-12 NOTE — Assessment & Plan Note (Signed)
2/2 dehydration; SNF - will follow with BMP

## 2015-11-12 NOTE — Assessment & Plan Note (Signed)
Currently holding Celexa. Continue benzodiazepine- decrease valium

## 2015-11-12 NOTE — Assessment & Plan Note (Signed)
The patient was having significant visual/auditory hallucination--- has lessened in the last 24 hours  -Differential diagnoses include benzodiazepine withdrawal since the patient has been on Xanax 1 mg every 8 hours as needed for more than 20 years, and recently has been tapered off due to memory issue. Patient was also recently started on Celexa, one week ago, and his presentation can also be explained by serotonin syndrome. Regardless of treatment is similar. scheduled Valium every 12 hours Prn ativan Discontinue Celexa. Psych consult appreciated- Does not meet criteria for inpt psych SNF - still having hallucinations, auditory - have increased his seroquel to 50 mg q HS

## 2015-11-12 NOTE — Assessment & Plan Note (Signed)
SNF - not stated as uncontrolled; cont prilosec 40 mg daily

## 2015-11-12 NOTE — Assessment & Plan Note (Signed)
2/2 dehydraton; SNF - will follow with Mg+ level

## 2015-11-12 NOTE — Assessment & Plan Note (Signed)
Known prior history of A. fib with chronic anticoagulation- coumadin Echocardiogram done TSH outpatient Cardizem increased SNF - Continue Lopressor 25 mg twice a day.and cont cardizem 90 mg q6 for rate it coumadin as prophylaxis-will be actively titrated

## 2015-11-25 ENCOUNTER — Encounter: Payer: Self-pay | Admitting: Internal Medicine

## 2015-11-25 ENCOUNTER — Non-Acute Institutional Stay (SKILLED_NURSING_FACILITY): Payer: Medicare Other | Admitting: Internal Medicine

## 2015-11-25 DIAGNOSIS — K219 Gastro-esophageal reflux disease without esophagitis: Secondary | ICD-10-CM

## 2015-11-25 DIAGNOSIS — F0280 Dementia in other diseases classified elsewhere without behavioral disturbance: Secondary | ICD-10-CM

## 2015-11-25 DIAGNOSIS — F329 Major depressive disorder, single episode, unspecified: Secondary | ICD-10-CM

## 2015-11-25 DIAGNOSIS — E785 Hyperlipidemia, unspecified: Secondary | ICD-10-CM | POA: Diagnosis not present

## 2015-11-25 DIAGNOSIS — I10 Essential (primary) hypertension: Secondary | ICD-10-CM | POA: Diagnosis not present

## 2015-11-25 DIAGNOSIS — F13232 Sedative, hypnotic or anxiolytic dependence with withdrawal with perceptual disturbance: Secondary | ICD-10-CM | POA: Diagnosis not present

## 2015-11-25 DIAGNOSIS — F13932 Sedative, hypnotic or anxiolytic use, unspecified with withdrawal with perceptual disturbances: Secondary | ICD-10-CM

## 2015-11-25 DIAGNOSIS — F0391 Unspecified dementia with behavioral disturbance: Secondary | ICD-10-CM

## 2015-11-25 DIAGNOSIS — F03918 Unspecified dementia, unspecified severity, with other behavioral disturbance: Secondary | ICD-10-CM

## 2015-11-25 DIAGNOSIS — F0393 Unspecified dementia, unspecified severity, with mood disturbance: Secondary | ICD-10-CM

## 2015-11-25 DIAGNOSIS — I4891 Unspecified atrial fibrillation: Secondary | ICD-10-CM | POA: Diagnosis not present

## 2015-11-25 DIAGNOSIS — E876 Hypokalemia: Secondary | ICD-10-CM

## 2015-11-25 NOTE — Progress Notes (Signed)
Patient ID: STEFFAN CANIGLIA, male   DOB: [birth year], 70 y.o.   MRN: [id number] MRN: [id number] Name: Ryan Barker  Sex: male Age: 70 y.o. DOB: 1945-10-23  [facility] #: [facility] Facility/Room: 108 Level Of Care: SNF Provider: Margit Hanks Emergency Contacts: Extended Emergency Contact Information Primary Emergency Contact: [facility] Address: [address]          [place] [address] [place] Home Phone: [phone] Mobile Phone: [phone] Relation: Spouse  Code Status:   Allergies: Review of patient's allergies indicates no known allergies.  Chief Complaint  Patient presents with  . Discharge Note    discharge from facility    HPI: Patient is 70 y.o. male who was admitted to [facility] from 2/19-27 where he was treated for AF with RVR, benzodiazepine withdrawal and multiple electrolyte abnormalities.Pt was admitted to SNF with generalized weakness for OT/PT. Pt s ready to be d/c to home.  Past Medical History  Diagnosis Date  . Gout   . HTN (hypertension)   . Depressive disorder   . Atrial fibrillation (HCC)   . Osteoarthritis   . Obesity   . GERD (gastroesophageal reflux disease)   . Hypercholesteremia   . Carpal tunnel syndrome   . Legally blind in left eye, as defined in [place]   . Hypercholesteremia   . 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  They did surgery on that 15 yrs ago."    Past Surgical History  Procedure Laterality Date  . Tonsillectomy    . Inguinal hernia repair        Medication List       This list is accurate as of: 11/25/15  1:19 PM.  Always use your most recent med list.               acetaminophen 650 MG CR tablet  Commonly known as:  TYLENOL  Take 1,300 mg by mouth every 8 (eight) hours as needed for pain.     allopurinol 300 MG tablet  Commonly known as:  ZYLOPRIM  Take 300 mg by mouth daily.     diazepam 2 MG tablet  Commonly known as:  VALIUM  Take 0.5 tablets (1 mg  total) by mouth 2 (two) times daily.     diltiazem 90 MG tablet  Commonly known as:  CARDIZEM  Take 1 tablet (90 mg total) by mouth every 6 (six) hours.     feeding supplement (PRO-STAT SUGAR FREE 64) Liqd  Offer 30 ml Prostat twice daily with medpass.     lisinopril 40 MG tablet  Commonly known as:  PRINIVIL,ZESTRIL  Take 40 mg by mouth daily.     LORazepam 0.5 MG tablet  Commonly known as:  ATIVAN  Take 1 tablet (0.5 mg total) by mouth every 12 (twelve) hours as needed for anxiety.     metoprolol tartrate 25 MG tablet  Commonly known as:  LOPRESSOR  Take 25 mg by mouth 2 (two) times daily.     multivitamin with minerals Tabs tablet  Take 1 tablet by mouth daily.     nystatin 100000 UNIT/GM Powd  Apply to groin     omeprazole 40 MG capsule  Commonly known as:  PRILOSEC  Take 40 mg by mouth daily.     pravastatin 40 MG tablet  Commonly known as:  PRAVACHOL  Take 40 mg by mouth daily.     QUEtiapine 25 MG tablet  Commonly  known as:  SEROQUEL  Take 1 tablet (25 mg total) by mouth at bedtime.     thiamine 100 MG tablet  Take 1 tablet (100 mg total) by mouth daily.     warfarin 5 MG tablet  Commonly known as:  COUMADIN  Take 2.5-5 mg by mouth daily. Take 5mg  ( 1 tablet) on Tuesdays, Fridays, Sunday . Then take 2.5mg  ( one-half ) of a tablet on Monday, Wednesday, Thursday , Saturday.        Meds ordered this encounter  Medications  . Amino Acids-Protein Hydrolys (FEEDING SUPPLEMENT, PRO-STAT SUGAR FREE 64,) LIQD    Sig: Offer 30 ml Prostat twice daily with medpass.    Immunization History  Administered Date(s) Administered  . PPD Test 11/07/2015    Social History  Substance Use Topics  . Smoking status: Never Smoker   . Smokeless tobacco: Not on file  . Alcohol Use: No    Filed Vitals:   11/25/15 0907  BP: 154/89  Pulse: 99  Temp: 97.1 F (36.2 C)  Resp: 18    Physical Exam  GENERAL APPEARANCE: Alert, conversant. No acute distress.  HEENT:  Unremarkable. RESPIRATORY: Breathing is even, unlabored. Lung sounds are clear   CARDIOVASCULAR: Heart RRR no murmurs, rubs or gallops. No peripheral edema.  GASTROINTESTINAL: Abdomen is soft, non-tender, not distended w/ normal bowel sounds.  NEUROLOGIC: Cranial nerves 2-12 grossly intact. Moves all extremities  Patient Active Problem List   Diagnosis Date Noted  . Serotonin syndrome 10/31/2015  . GERD (gastroesophageal reflux disease) 10/31/2015  . Atrial fibrillation with rapid ventricular response (HCC) 10/30/2015  . 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 Hypokalemia 10/30/2015  . Atrial fibrillation with RVR (HCC) 10/30/2015  . Hypomagnesemia 10/30/2015  . Dementia with behavioral disturbance 10/24/2015  . 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 Alteration in self-care ability   . Chronic atrial fibrillation (HCC) 06/24/2013    Class: Chronic  . Long term current use of anticoagulant therapy 06/24/2013  . Hyperlipidemia 06/24/2013  . Essential hypertension, benign 06/24/2013    CBC    Component Value Date/Time   WBC 9.2 11/11/2015   WBC 8.9 11/06/2015 0518   RBC 4.14* 11/06/2015 0518   HGB 13.5 11/11/2015   HCT 40* 11/11/2015   PLT 220 11/11/2015   MCV 101.0* 11/06/2015 0518   LYMPHSABS 1.8 10/31/2015 0134   MONOABS 1.4* 10/31/2015 0134   EOSABS 0.1 10/31/2015 0134   BASOSABS 0.0 10/31/2015 0134    CMP     Component Value Date/Time   NA 134* 11/11/2015   NA 137 11/06/2015 0518   K 3.6 11/11/2015   CL 98* 11/06/2015 0518   CO2 27 11/06/2015 0518   GLUCOSE 104* 11/06/2015 0518   BUN 21 11/11/2015   BUN 21* 11/06/2015 0518   CREATININE 0.9 11/11/2015   CREATININE 0.98 11/06/2015 0518   CALCIUM 8.9 11/06/2015 0518   PROT 6.9 10/31/2015 0134   ALBUMIN 4.2 10/31/2015 0134   AST 23 10/31/2015 0134   ALT 12* 10/31/2015 0134   ALKPHOS 83 10/31/2015 0134   BILITOT 1.1 10/31/2015 0134   GFRNONAA >60 11/06/2015 0518   GFRAA  >60 11/06/2015 0518    Assessment and Plan  Pt is d/c to home with HH/OT/PT/nursing. Medications have been reconciled and Rx's written. Pt's next INR should be 3/22.   Time spent > 30 min;> 50% of time with patient was spent reviewing records, labs, tests and studies, counseling and developing plan of care  Inocencio Homes, MD

## 2015-11-29 ENCOUNTER — Emergency Department (HOSPITAL_COMMUNITY)
Admission: EM | Admit: 2015-11-29 | Discharge: 2015-11-30 | Disposition: A | Discharge status: Home / Self Care | Payer: Medicare Other | Attending: Emergency Medicine | Admitting: Emergency Medicine

## 2015-11-29 ENCOUNTER — Encounter (HOSPITAL_COMMUNITY): Payer: Self-pay

## 2015-11-29 DIAGNOSIS — Z7901 Long term (current) use of anticoagulants: Secondary | ICD-10-CM | POA: Insufficient documentation

## 2015-11-29 DIAGNOSIS — E7800 Pure hypercholesterolemia, unspecified: Secondary | ICD-10-CM | POA: Diagnosis not present

## 2015-11-29 DIAGNOSIS — I4891 Unspecified atrial fibrillation: Secondary | ICD-10-CM | POA: Diagnosis not present

## 2015-11-29 DIAGNOSIS — K219 Gastro-esophageal reflux disease without esophagitis: Secondary | ICD-10-CM | POA: Diagnosis not present

## 2015-11-29 DIAGNOSIS — E669 Obesity, unspecified: Secondary | ICD-10-CM | POA: Insufficient documentation

## 2015-11-29 DIAGNOSIS — Z79899 Other long term (current) drug therapy: Secondary | ICD-10-CM | POA: Diagnosis not present

## 2015-11-29 DIAGNOSIS — F329 Major depressive disorder, single episode, unspecified: Secondary | ICD-10-CM | POA: Insufficient documentation

## 2015-11-29 DIAGNOSIS — M109 Gout, unspecified: Secondary | ICD-10-CM | POA: Diagnosis not present

## 2015-11-29 DIAGNOSIS — I10 Essential (primary) hypertension: Secondary | ICD-10-CM | POA: Insufficient documentation

## 2015-11-29 DIAGNOSIS — Z87718 Personal history of other specified (corrected) congenital malformations of genitourinary system: Secondary | ICD-10-CM | POA: Diagnosis not present

## 2015-11-29 DIAGNOSIS — M1990 Unspecified osteoarthritis, unspecified site: Secondary | ICD-10-CM | POA: Diagnosis not present

## 2015-11-29 DIAGNOSIS — H548 Legal blindness, as defined in USA: Secondary | ICD-10-CM | POA: Diagnosis not present

## 2015-11-29 MED ORDER — DILTIAZEM HCL 90 MG PO TABS
90.0000 mg | ORAL_TABLET | Freq: Once | ORAL | Status: AC
  Administered 2015-11-29: 90 mg via ORAL
  Filled 2015-11-29: qty 1

## 2015-11-29 MED ORDER — LORAZEPAM 1 MG PO TABS
0.5000 mg | ORAL_TABLET | Freq: Once | ORAL | Status: AC
  Administered 2015-11-29: 0.5 mg via ORAL
  Filled 2015-11-29: qty 1

## 2015-11-29 NOTE — ED Notes (Signed)
[facility]. Patient was in process of being discharged from [facility] to home when the staff discovered his BP extremely high. Patient reports that he had his daily meds this am, dednies pain. Alert and oriented

## 2015-11-29 NOTE — Discharge Instructions (Signed)
Hypertension Hypertension, commonly called high blood pressure, is when the force of blood pumping through your arteries is too strong. Your arteries are the blood vessels that carry blood from your heart throughout your body. A blood pressure reading consists of a higher number over a lower number, such as 110/72. The higher number (systolic) is the pressure inside your arteries when your heart pumps. The lower number (diastolic) is the pressure inside your arteries when your heart relaxes. Ideally you want your blood pressure below 120/80. Hypertension forces your heart to work harder to pump blood. Your arteries may become narrow or stiff. Having untreated or uncontrolled hypertension can cause heart attack, stroke, kidney disease, and other problems. RISK FACTORS Some risk factors for high blood pressure are controllable. Others are not.  Risk factors you cannot control include:   Race. You may be at higher risk if you are African American.  Age. Risk increases with age.  Gender. Men are at higher risk than women before age 45 years. After age 65, women are at higher risk than men. Risk factors you can control include:  Not getting enough exercise or physical activity.  Being overweight.  Getting too much fat, sugar, calories, or salt in your diet.  Drinking too much alcohol. SIGNS AND SYMPTOMS Hypertension does not usually cause signs or symptoms. Extremely high blood pressure (hypertensive crisis) may cause headache, anxiety, shortness of breath, and nosebleed. DIAGNOSIS To check if you have hypertension, your health care provider will measure your blood pressure while you are seated, with your arm held at the level of your heart. It should be measured at least twice using the same arm. Certain conditions can cause a difference in blood pressure between your right and left arms. A blood pressure reading that is higher than normal on one occasion does not mean that you need treatment. If  it is not clear whether you have high blood pressure, you may be asked to return on a different day to have your blood pressure checked again. Or, you may be asked to monitor your blood pressure at home for 1 or more weeks. TREATMENT Treating high blood pressure includes making lifestyle changes and possibly taking medicine. Living a healthy lifestyle can help lower high blood pressure. You may need to change some of your habits. Lifestyle changes may include:  Following the DASH diet. This diet is high in fruits, vegetables, and whole grains. It is low in salt, red meat, and added sugars.  Keep your sodium intake below 2,300 mg per day.  Getting at least 30-45 minutes of aerobic exercise at least 4 times per week.  Losing weight if necessary.  Not smoking.  Limiting alcoholic beverages.  Learning ways to reduce stress. Your health care provider may prescribe medicine if lifestyle changes are not enough to get your blood pressure under control, and if one of the following is true:  You are 18-59 years of age and your systolic blood pressure is above 140.  You are 60 years of age or older, and your systolic blood pressure is above 150.  Your diastolic blood pressure is above 90.  You have diabetes, and your systolic blood pressure is over 140 or your diastolic blood pressure is over 90.  You have kidney disease and your blood pressure is above 140/90.  You have heart disease and your blood pressure is above 140/90. Your personal target blood pressure may vary depending on your medical conditions, your age, and other factors. HOME CARE INSTRUCTIONS    Have your blood pressure rechecked as directed by your health care provider.   Take medicines only as directed by your health care provider. Follow the directions carefully. Blood pressure medicines must be taken as prescribed. The medicine does not work as well when you skip doses. Skipping doses also puts you at risk for  problems.  Do not smoke.   Monitor your blood pressure at home as directed by your health care provider. SEEK MEDICAL CARE IF:   You think you are having a reaction to medicines taken.  You have recurrent headaches or feel dizzy.  You have swelling in your ankles.  You have trouble with your vision. SEEK IMMEDIATE MEDICAL CARE IF:  You develop a severe headache or confusion.  You have unusual weakness, numbness, or feel faint.  You have severe chest or abdominal pain.  You vomit repeatedly.  You have trouble breathing. MAKE SURE YOU:   Understand these instructions.  Will watch your condition.  Will get help right away if you are not doing well or get worse.   This information is not intended to replace advice given to you by your health care provider. Make sure you discuss any questions you have with your health care provider.   Document Released: 08/27/2005 Document Revised: 01/11/2015 Document Reviewed: 06/19/2013 Elsevier Interactive Patient Education 2016 [employer]  

## 2015-11-29 NOTE — ED Provider Notes (Signed)
CSN: [id number]     Arrival date & time 11/29/15  1643 History   First MD Initiated Contact with Patient 11/29/15 1649     Chief Complaint  Patient presents with  . Hypertension     HPI 70 y.o. male with history of hypertension, atrial fibrillation on Coumadin, HTN, HLD. 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 Patient was getting ready to be discharged from the rehabilitation facility when his blood pressure was noted to be high. It is unclear how high and the patient does not know. He denies vision changes, headache, chest pain, shortness of breath, abdominal pain, decreased urine output. Blood pressure is 132/80 on arrival here. He reports compliance with his home blood pressure medications.   Past Medical History  Diagnosis Date  . Gout   . HTN (hypertension)   . Depressive disorder   . Atrial fibrillation (HCC)   . Osteoarthritis   . Obesity   . GERD (gastroesophageal reflux disease)   . Hypercholesteremia   . Carpal tunnel syndrome   . Legally blind in left eye, as defined in [place]   . Hypercholesteremia   . 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  They did surgery on that 15 yrs ago."   Past Surgical History  Procedure Laterality Date  . Tonsillectomy    . Inguinal hernia repair     No family history on file. Social History  Substance Use Topics  . Smoking status: Never Smoker   . Smokeless tobacco: None  . Alcohol Use: No    Review of Systems  Constitutional: Negative for fever, chills, activity change and appetite change.  HENT: Negative for congestion, facial swelling, rhinorrhea and sore throat.   Eyes: Negative for visual disturbance.  Respiratory: Negative for cough, shortness of breath and wheezing.   Cardiovascular: Negative for chest pain, palpitations and leg swelling.   Gastrointestinal: Negative for nausea, vomiting, abdominal pain, diarrhea, constipation and blood in stool.  Genitourinary: Negative for dysuria, frequency, hematuria, flank pain and difficulty urinating.  Musculoskeletal: Negative for myalgias, back pain, joint swelling, arthralgias, neck pain and neck stiffness.  Skin: Negative for rash.  Neurological: Negative for dizziness, weakness, light-headedness and headaches.  Psychiatric/Behavioral: Negative for behavioral problems, confusion and agitation.      Allergies  Review of patient's allergies indicates no known allergies.  Home Medications   Prior to Admission medications   Medication Sig Start Date End Date Taking? Authorizing Provider  acetaminophen (TYLENOL) 650 MG CR tablet Take 1,300 mg by mouth every 8 (eight) hours as needed for pain.    Historical Provider, MD  allopurinol (ZYLOPRIM) 300 MG tablet Take 300 mg by mouth daily.    Historical Provider, MD  Amino Acids-Protein Hydrolys (FEEDING SUPPLEMENT, PRO-STAT SUGAR FREE 64,) LIQD Offer 30 ml Prostat twice daily with medpass.    Historical Provider, MD  diazepam (VALIUM) 2 MG tablet Take 0.5 tablets (1 mg total) by mouth 2 (two) times daily. 11/07/15   Joseph Art, DO  diltiazem (CARDIZEM) 90 MG tablet Take 1 tablet (90 mg total) by mouth every 6 (six) hours. 11/07/15   Joseph Art, DO  lisinopril (PRINIVIL,ZESTRIL) 40 MG tablet Take 40 mg by mouth daily.    Historical Provider, MD  LORazepam (ATIVAN) 0.5 MG tablet Take 1 tablet (0.5 mg total) by mouth every 12 (twelve)  hours as needed for anxiety. 11/07/15   Joseph ArtJessica U Vann, DO  metoprolol tartrate (LOPRESSOR) 25 MG tablet Take 25 mg by mouth 2 (two) times daily.    Historical Provider, MD  Multiple Vitamin (MULTIVITAMIN WITH MINERALS) TABS tablet Take 1 tablet by mouth daily. 11/07/15   Joseph ArtJessica U Vann, DO  nystatin (MYCOSTATIN/NYSTOP) 100000 UNIT/GM POWD Apply to groin 11/07/15   Joseph ArtJessica U Vann, DO  omeprazole (PRILOSEC) 40  MG capsule Take 40 mg by mouth daily.    Historical Provider, MD  pravastatin (PRAVACHOL) 40 MG tablet Take 40 mg by mouth daily.    Historical Provider, MD  QUEtiapine (SEROQUEL) 25 MG tablet Take 1 tablet (25 mg total) by mouth at bedtime. 11/07/15   Joseph ArtJessica U Vann, DO  thiamine 100 MG tablet Take 1 tablet (100 mg total) by mouth daily. 11/07/15   Joseph ArtJessica U Vann, DO  warfarin (COUMADIN) 5 MG tablet Take 2.5-5 mg by mouth daily. Take 5mg  ( 1 tablet) on Tuesdays, Fridays, Sunday . Then take 2.5mg  ( one-half ) of a tablet on Monday, Wednesday, Thursday , Saturday.    Historical Provider, MD   BP 154/94 mmHg  Pulse 98  Temp(Src) 98.7 F (37.1 C) (Oral)  Resp 13  Ht 5\' 9"  (1.753 m)  Wt 138.347 kg  BMI 45.02 kg/m2  SpO2 100% Physical Exam  Constitutional: He is oriented to person, place, and time. He appears well-developed and well-nourished. No distress.  HENT:  Head: Normocephalic and atraumatic.  Right Ear: External ear normal.  Left Ear: External ear normal.  Nose: Nose normal.  Mouth/Throat: Oropharynx is clear and moist. No oropharyngeal exudate.  Eyes: Conjunctivae are normal. Pupils are equal, round, and reactive to light. Right eye exhibits no discharge. Left eye exhibits no discharge. No scleral icterus.  Neck: Normal range of motion. Neck supple. No tracheal deviation present.  Cardiovascular: Normal heart sounds.  Exam reveals no gallop and no friction rub.   No murmur heard. Tachycardic, irregularly irregular rhythm  Pulmonary/Chest: Effort normal and breath sounds normal. No respiratory distress. He has no wheezes. He has no rales.  Abdominal: Soft. Bowel sounds are normal. He exhibits no distension and no mass. There is no tenderness. There is no rebound and no guarding.  Musculoskeletal: Normal range of motion. He exhibits no edema or tenderness.  Neurological: He is alert and oriented to person, place, and time. He exhibits normal muscle tone.  Skin: Skin is warm and dry.  No rash noted. He is not diaphoretic.  Psychiatric: He has a normal mood and affect. His behavior is normal. Judgment and thought content normal.    ED Course  Procedures (including critical care time) Labs Review Labs Reviewed - No data to display  Imaging Review No results found. I have personally reviewed and evaluated these images and lab results as part of my medical decision-making.   EKG Interpretation None      MDM   Final diagnoses:  Essential hypertension    On arrival patient is well-appearing. He denies symptoms including chest pain, shortness of breath, abdominal pain, headache, vision changes. Doubt hypertensive emergency or urgency. He reports compliance with his home blood pressure medications. EKG with his known atrial fibrillation without RVR. Plan to discharge home with [facility] transport. He is stable at the time of discharge.    Jenifer Ernestina PennaBrunno Irick, MD 11/29/15 [id number]  Tilden FossaElizabeth Rees, MD 12/03/15 [phone]

## 2015-12-14 NOTE — Progress Notes (Signed)
This encounter was created in error - please disregard.

## 2016-06-13 ENCOUNTER — Ambulatory Visit: Payer: Medicare Other | Admitting: Podiatry

## 2016-06-19 ENCOUNTER — Emergency Department

## 2016-06-19 ENCOUNTER — Encounter: Payer: Self-pay | Admitting: Emergency Medicine

## 2016-06-19 ENCOUNTER — Emergency Department
Admission: EM | Admit: 2016-06-19 | Discharge: 2016-06-20 | Disposition: A | Discharge status: Home / Self Care | Attending: Emergency Medicine | Admitting: Emergency Medicine

## 2016-06-19 DIAGNOSIS — Z7901 Long term (current) use of anticoagulants: Secondary | ICD-10-CM | POA: Insufficient documentation

## 2016-06-19 DIAGNOSIS — I4891 Unspecified atrial fibrillation: Secondary | ICD-10-CM | POA: Insufficient documentation

## 2016-06-19 DIAGNOSIS — I10 Essential (primary) hypertension: Secondary | ICD-10-CM | POA: Diagnosis not present

## 2016-06-19 DIAGNOSIS — R531 Weakness: Secondary | ICD-10-CM | POA: Insufficient documentation

## 2016-06-19 DIAGNOSIS — Z79899 Other long term (current) drug therapy: Secondary | ICD-10-CM | POA: Diagnosis not present

## 2016-06-19 DIAGNOSIS — F0390 Unspecified dementia without behavioral disturbance: Secondary | ICD-10-CM | POA: Insufficient documentation

## 2016-06-19 DIAGNOSIS — R109 Unspecified abdominal pain: Secondary | ICD-10-CM | POA: Diagnosis present

## 2016-06-19 DIAGNOSIS — G930 Cerebral cysts: Secondary | ICD-10-CM | POA: Diagnosis not present

## 2016-06-19 DIAGNOSIS — G9389 Other specified disorders of brain: Secondary | ICD-10-CM

## 2016-06-19 LAB — COMPREHENSIVE METABOLIC PANEL
ALK PHOS: 112 U/L (ref 38–126)
ALT: 15 U/L — AB (ref 17–63)
ANION GAP: 9 (ref 5–15)
AST: 29 U/L (ref 15–41)
Albumin: 3.3 g/dL — ABNORMAL LOW (ref 3.5–5.0)
BILIRUBIN TOTAL: 1.2 mg/dL (ref 0.3–1.2)
BUN: 33 mg/dL — ABNORMAL HIGH (ref 6–20)
CALCIUM: 9.3 mg/dL (ref 8.9–10.3)
CO2: 30 mmol/L (ref 22–32)
CREATININE: 1.25 mg/dL — AB (ref 0.61–1.24)
Chloride: 103 mmol/L (ref 101–111)
GFR calc non Af Amer: 56 mL/min — ABNORMAL LOW (ref >60)
Glucose, Bld: 112 mg/dL — ABNORMAL HIGH (ref 65–99)
Potassium: 4.4 mmol/L (ref 3.5–5.1)
SODIUM: 142 mmol/L (ref 135–145)
TOTAL PROTEIN: 7.3 g/dL (ref 6.5–8.1)

## 2016-06-19 LAB — URINALYSIS COMPLETE WITH MICROSCOPIC (ARMC ONLY)
Bilirubin Urine: NEGATIVE
GLUCOSE, UA: NEGATIVE mg/dL
Nitrite: NEGATIVE
PH: 7 (ref 5.0–8.0)
Protein, ur: 100 mg/dL — AB
Specific Gravity, Urine: 1.024 (ref 1.005–1.030)

## 2016-06-19 LAB — LIPASE, BLOOD: LIPASE: 22 U/L (ref 11–51)

## 2016-06-19 LAB — CBC
HEMATOCRIT: 44.7 % (ref 40.0–52.0)
HEMOGLOBIN: 15.2 g/dL (ref 13.0–18.0)
MCH: 33.4 pg (ref 26.0–34.0)
MCHC: 33.9 g/dL (ref 32.0–36.0)
MCV: 98.6 fL (ref 80.0–100.0)
Platelets: 238 10*3/uL (ref 150–440)
RBC: 4.54 MIL/uL (ref 4.40–5.90)
RDW: 14.4 % (ref 11.5–14.5)
WBC: 11.2 10*3/uL — ABNORMAL HIGH (ref 3.8–10.6)

## 2016-06-19 LAB — PROTIME-INR
INR: 2.74
PROTHROMBIN TIME: 29.6 s — AB (ref 11.4–15.2)

## 2016-06-19 LAB — TROPONIN I: Troponin I: 0.04 ng/mL (ref <0.03)

## 2016-06-19 MED ORDER — MORPHINE SULFATE (PF) 4 MG/ML IV SOLN
4.0000 mg | Freq: Once | INTRAVENOUS | Status: AC
  Administered 2016-06-19: 4 mg via INTRAVENOUS
  Filled 2016-06-19: qty 1

## 2016-06-19 MED ORDER — IOPAMIDOL (ISOVUE-300) INJECTION 61%
100.0000 mL | Freq: Once | INTRAVENOUS | Status: AC | PRN
  Administered 2016-06-19: 100 mL via INTRAVENOUS

## 2016-06-19 MED ORDER — IOPAMIDOL (ISOVUE-300) INJECTION 61%: 30.0000 mL | Freq: Once | INTRAVENOUS | Status: DC | PRN

## 2016-06-19 MED ORDER — DILTIAZEM HCL 25 MG/5ML IV SOLN
10.0000 mg | Freq: Once | INTRAVENOUS | Status: AC
  Administered 2016-06-19: 10 mg via INTRAVENOUS
  Filled 2016-06-19: qty 5

## 2016-06-19 MED ORDER — SODIUM CHLORIDE 0.9 % IV BOLUS (SEPSIS)
1000.0000 mL | Freq: Once | INTRAVENOUS | Status: AC
  Administered 2016-06-19: 1000 mL via INTRAVENOUS

## 2016-06-19 NOTE — ED Notes (Signed)
Spouse and daughter at bedside 

## 2016-06-19 NOTE — ED Triage Notes (Signed)
Pt presents to ED from home with c/o abdominal pain and constipation x2 days. EMS reports baseline heart rate 140-160. Pt on hospice care. Family reports not able to take care of the pt at home and would perfer pt transfer to hospice home after from here.

## 2016-06-19 NOTE — ED Provider Notes (Addendum)
[facility] Emergency Department Provider Note  Time seen: 9:18 PM  I have reviewed the triage vital signs and the nursing notes.   HISTORY  Chief Complaint Abdominal Pain and Constipation    HPI Ryan Barker is a 70 y.o. male with a past medical history of atrial fibrillation, hypertension, hyperlipidemia, who is currently on hospice care who presents for abdominal pain. At this time the story is very unclear, per report the hospice nurse states the patient appears to be complaining of abdominal pain. States he has a history of abdominal pain for 2 years prescribe narcotic medications at home, but it appeared to be worse. She states the patient's heart rate is always between 120-160 bpm.They cannot give a clear reason for why the patient is on hospice care. We are currently awaiting family or nurse arrival to give more detailed history and reason for presentation. We did receive the report that they do not feel they can care for the patient any longer at home and he would need to be discharged to hospice house if he is going home.  Past Medical History:  Diagnosis Date  . Atrial fibrillation (HCC)   . Carpal tunnel syndrome   . Depressive disorder   . GERD (gastroesophageal reflux disease)   . Gout   . Hidden penis    Pt stated "it's always been that way since grade school.  They did surgery on that 15 yrs ago."  . HTN (hypertension)   . Hypercholesteremia   . Hypercholesteremia   . Legally blind in left eye, as defined in [place]   . Obesity   . Osteoarthritis     Patient Active Problem List   Diagnosis Date Noted  . Serotonin syndrome 10/31/2015  . GERD (gastroesophageal reflux disease) 10/31/2015  . Atrial fibrillation with rapid ventricular response (HCC) 10/30/2015  . 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 Hypokalemia 10/30/2015  . Atrial fibrillation with RVR (HCC) 10/30/2015  . Hypomagnesemia 10/30/2015  .  Dementia with behavioral disturbance 10/24/2015  . 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 Alteration in self-care ability   . Chronic atrial fibrillation (HCC) 06/24/2013    Class: Chronic  . Long term current use of anticoagulant therapy 06/24/2013  . Hyperlipidemia 06/24/2013  . Essential hypertension, benign 06/24/2013    Past Surgical History:  Procedure Laterality Date  . INGUINAL HERNIA REPAIR    . TONSILLECTOMY      Prior to Admission medications   Medication Sig Start Date End Date Taking? Authorizing Provider  acetaminophen (TYLENOL) 650 MG CR tablet Take 1,300 mg by mouth every 8 (eight) hours as needed for pain.    Historical Provider, MD  allopurinol (ZYLOPRIM) 300 MG tablet Take 300 mg by mouth daily.    Historical Provider, MD  Amino Acids-Protein Hydrolys (FEEDING SUPPLEMENT, PRO-STAT SUGAR FREE 64,) LIQD Offer 30 ml Prostat twice daily with medpass.    Historical Provider, MD  diazepam (VALIUM) 2 MG tablet Take 0.5 tablets (1 mg total) by mouth 2 (two) times daily. 11/07/15   Joseph Art, DO  diltiazem (CARDIZEM) 90 MG tablet Take 1 tablet (90 mg total) by mouth every 6 (six) hours. 11/07/15   Joseph Art, DO  lisinopril (PRINIVIL,ZESTRIL) 40 MG tablet Take 40 mg by mouth daily.    Historical Provider, MD  LORazepam (ATIVAN) 0.5 MG tablet Take 1 tablet (0.5 mg total) by mouth every 12 (twelve) hours as needed for anxiety. 11/07/15   Selinda Orion  Vann, DO  metoprolol tartrate (LOPRESSOR) 25 MG tablet Take 25 mg by mouth 2 (two) times daily.    Historical Provider, MD  Multiple Vitamin (MULTIVITAMIN WITH MINERALS) TABS tablet Take 1 tablet by mouth daily. 11/07/15   Joseph Art, DO  nystatin (MYCOSTATIN/NYSTOP) 100000 UNIT/GM POWD Apply to groin 11/07/15   Joseph Art, DO  omeprazole (PRILOSEC) 40 MG capsule Take 40 mg by mouth daily.    Historical Provider, MD  pravastatin (PRAVACHOL) 40 MG tablet Take 40 mg by mouth daily.    Historical Provider, MD  QUEtiapine  (SEROQUEL) 25 MG tablet Take 1 tablet (25 mg total) by mouth at bedtime. 11/07/15   Joseph Art, DO  thiamine 100 MG tablet Take 1 tablet (100 mg total) by mouth daily. 11/07/15   Joseph Art, DO  warfarin (COUMADIN) 5 MG tablet Take 2.5-5 mg by mouth daily. Take 5mg  ( 1 tablet) on Tuesdays, Fridays, Sunday . Then take 2.5mg  ( one-half ) of a tablet on Monday, Wednesday, Thursday , Saturday.    Historical Provider, MD    No Known Allergies  No family history on file.  Social History Social History  Substance Use Topics  . Smoking status: Never Smoker  . Smokeless tobacco: Not on file  . Alcohol use No    Review of Systems Unable to obtain a review of systems as the patient cannot adequately communicate, no family available at this time.  ____________________________________________   PHYSICAL EXAM:  Constitutional: Alert, disoriented, mumbles, cannot give a history. Eyes: Normal exam ENT   Head: Normocephalic and atraumatic.   Mouth/Throat: Mucous membranes are moist. Cardiovascular: Regular rhythm, rate around 140 bpm. Respiratory: Mild tachypnea. Breath sounds equal and clear bilaterally. Gastrointestinal: Obese, but soft, no reaction to abdominal palpation. Urinary Foley catheter present. Musculoskeletal: Nontender with normal range of motion in all extremities.  Neurologic:  Patient will speak one word answers with questionable accuracy Skin:  Skin is warm, dry and intact.    ____________________________________________    EKG  EKG reviewed and interpreted by myself shows tachycardia at 149 bpm, most consistent with atrial fibrillation with a wide complex. QRS is widened, QTC is prolonged, no obvious ST elevation, but nonspecific ST changes are present.  ____________________________________________    RADIOLOGY  Chest x-ray negative CT shows constipation, with a significant amount of stool in the rectal vault. CT the head shows a likely large mass  with signs of herniation and shift ____________________________________________   INITIAL IMPRESSION / ASSESSMENT AND PLAN / ED COURSE  Pertinent labs & imaging results that were available during my care of the patient were reviewed by me and considered in my medical decision making (see chart for details).  Family history just arrived for the patient. They give a much more clear history. Patient suffered a stroke approximately one week ago. Due to the grim prognosis the patient was transitioned to hospice care and is currently receiving hospice care at home. They state over the past several days the patient has not had a bowel movement, appears to be grimacing in pain which they believe is due to abdominal pain. Patient's heart rate was very elevated tonight around 140 and the hospice nurse recommended they go to the emergency department. Family states his normal heart rate is in the 70s. Patient does have a history of atrial fibrillation. She was on Coumadin until yesterday when the Coumadin was discontinued along with most of his home medications per the hospice physician. Family understands that  the patient is in hospice care and is likely dying, but still wished to workup the patient's abdominal pain, and tachycardia. We will proceed with a medical workup including labs, urinalysis, CT of the abdomen/pelvis. As the patient recently suffered an ischemic stroke on Coumadin or loss of proceed with a CT scan of the head to rule out hemorrhagic conversion, I discussed this with the family, they wish to proceed with a CT even knowing that it is unlikely to change the prognosis.  ----------------------------------------- 10:21 PM on 06/19/2016 -----------------------------------------  The wife states hospice took the patient off all of his medications 2 days ago including Coumadin, and diltiazem. This would likely explain why his heart rate is still elevated today. We will dose IV diltiazem and  continue to monitor. Labs otherwise are largely within normal limits. Troponin is slightly elevated but at baseline. Labs indicate mild dehydration. Patient does have a slight leukocytosis. Chest x-ray is negative. Currently pending CT scans.  Patient CT scan result. The abdominal CT shows constipation with a large amount of stool in the rectal vault. We'll manually disimpacted and then perform an enema, as this could be the source of discomfort for the patient. Patient's head CT shows what appears to be a very large mass with significant midline shift and signs of focal herniation. I discussed this with the family. It appears that the family took the patient to his PCP 1 week ago and was diagnosed with a stroke as the patient was having difficulty moving his left side, without any imaging performed. Given the patient's quick decline over the days preceding the PCP visit they recommended hospice care. Hospice care was initiated approximately 5 or 6 days ago. Family states the patient has had a very steep decline over the past one week, no longer eating, no longer drinking, rarely awakens to speak with them any longer. I discussed with the family this is all likely due to to the aggressive brain mass. Family understands, they would like to send the patient to hospice house if at all possible. I believe this is the best action for the patient as this appears to be a very aggressive very large mass and comfort measures are likely the most appropriate course of action.  Patient's heart rate is decreased currently 110 bpm after diltiazem. I discussed with the family restarting diltiazem if the patient is able to tolerate oral medications. We are currently awaiting confirmation of a bed hospice house.  Manual impaction performed with a moderate amount of stool removed. We will give an enema to help with symptoms of abdominal discomfort. Patient's urinalysis shows possible urinary tract infection although the patient  has a chronic Foley catheter and this is likely chronic colonization. Patient has a bed assigned a hospice house. We will discharge the patient to hospice house. Family is agreeable to this plan.  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

## 2016-06-20 MED ORDER — DILTIAZEM HCL ER 90 MG PO CP12: 90.0000 mg | ORAL_CAPSULE | Freq: Two times a day (BID) | ORAL | 0 refills | Status: AC

## 2016-06-20 MED ORDER — MORPHINE SULFATE (PF) 4 MG/ML IV SOLN
4.0000 mg | Freq: Once | INTRAVENOUS | Status: AC
  Administered 2016-06-20: 4 mg via INTRAVENOUS
  Filled 2016-06-20: qty 1

## 2016-06-20 NOTE — Discharge Instructions (Signed)
The workup is consistent with a large brain mass causing compression and shift of the brain. The patient is also currently in atrial fibrillation with rapid ventricular response, likely due to discontinuation of his diltiazem 2 days ago. It may be worthwhile to restart the diltiazem for heart rate control.

## 2016-07-11 DEATH — deceased

## 2016-07-26 ENCOUNTER — Ambulatory Visit: Payer: Self-pay | Admitting: Interventional Cardiology

## 2017-06-20 IMAGING — CT CT ABD-PELV W/ CM
2 of 5 series · 16 of 46 positions shown, 18 images · IV contrast (OMNIPAQUE 300)
Comparison: 12/06/2006

CLINICAL DATA: Mole a ease since last night

EXAM:
CT ABDOMEN AND PELVIS WITH CONTRAST
TECHNIQUE: Multidetector CT imaging of the abdomen and pelvis was performed
using the standard protocol following bolus administration of
intravenous contrast.
CONTRAST:  50mL OMNIPAQUE IOHEXOL 300 MG/ML SOLN, 100mL OMNIPAQUE
IOHEXOL 300 MG/ML SOLN

[Series 2: abd/pel with · axial · 0.96mm/px · z∈[-491,-116]mm · 13 of 85 slices shown, 15 images]
[im 5/85  soft-tissue]
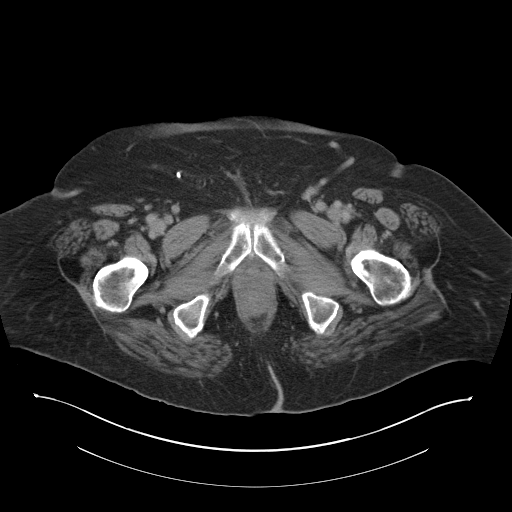
[im 5/85  bone]
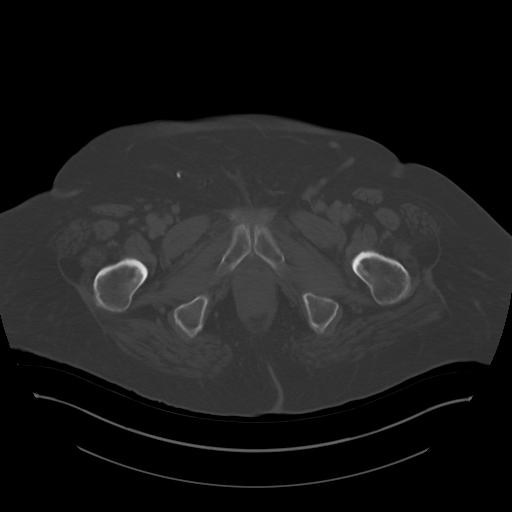
[im 10/85  soft-tissue]
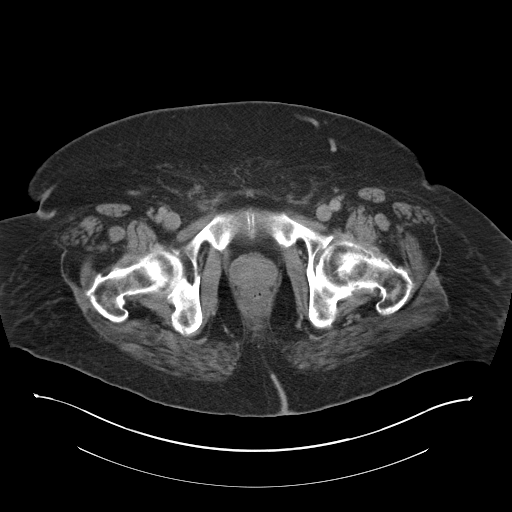
[im 19/85  soft-tissue]
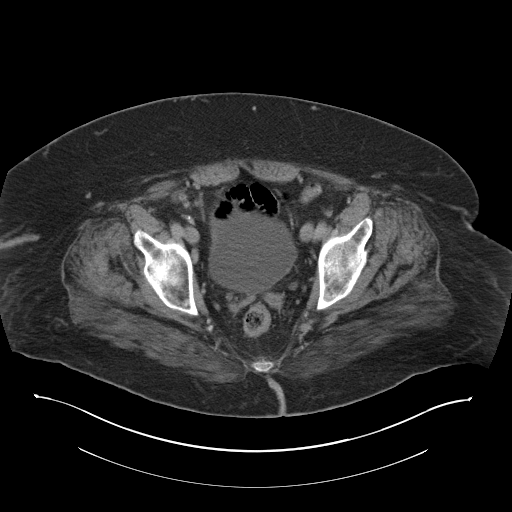
[im 24/85  soft-tissue]
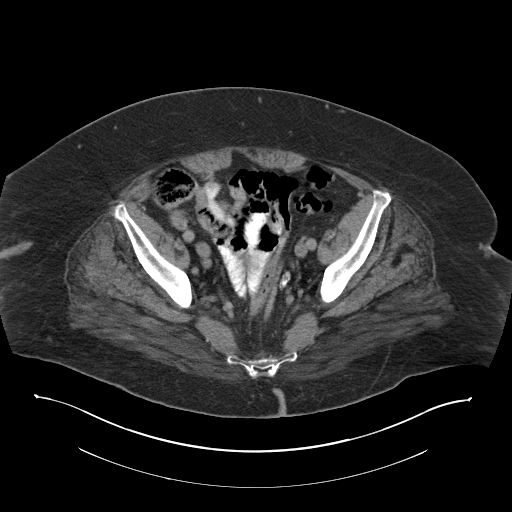
[im 29/85  soft-tissue]
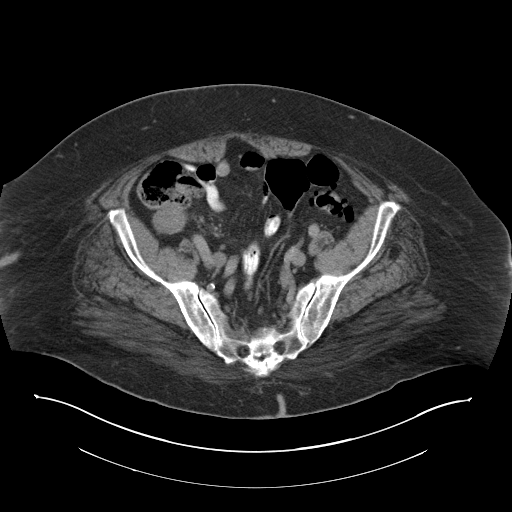
[im 38/85  soft-tissue]
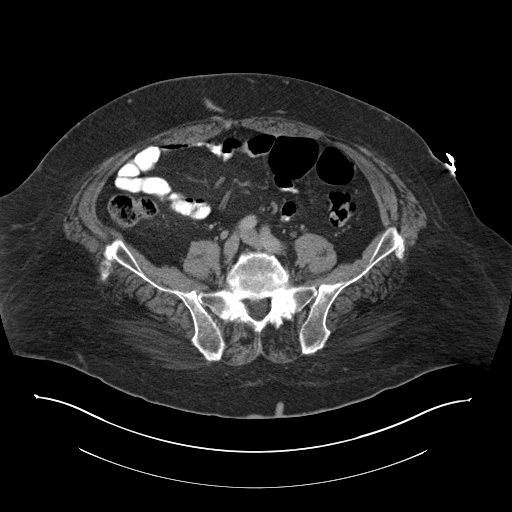
[im 43/85  soft-tissue]
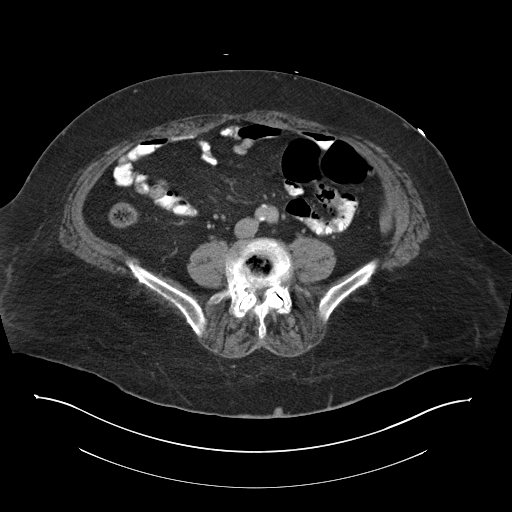
[im 47/85  soft-tissue]
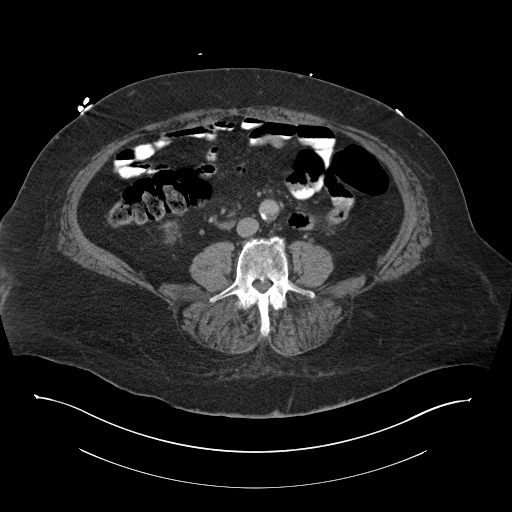
[im 57/85  soft-tissue]
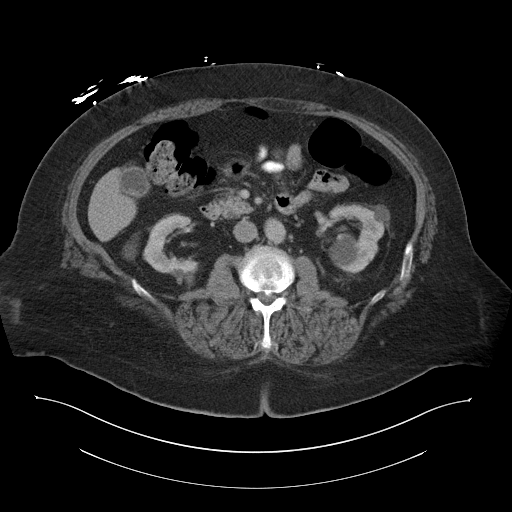
[im 57/85  bone]
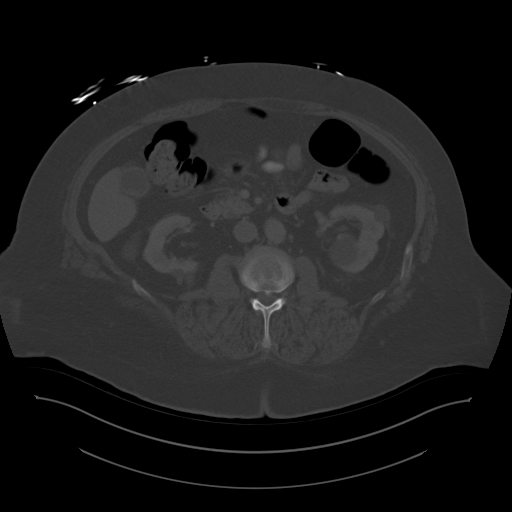
[im 61/85  soft-tissue]
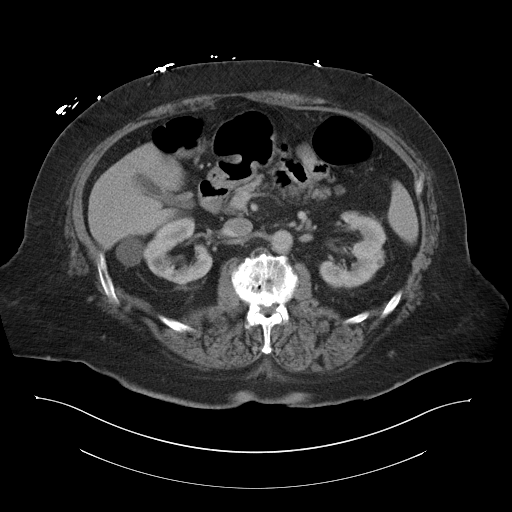
[im 66/85  soft-tissue]
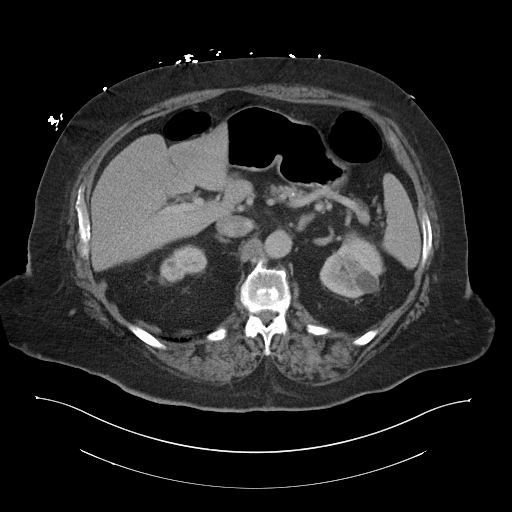
[im 75/85  soft-tissue]
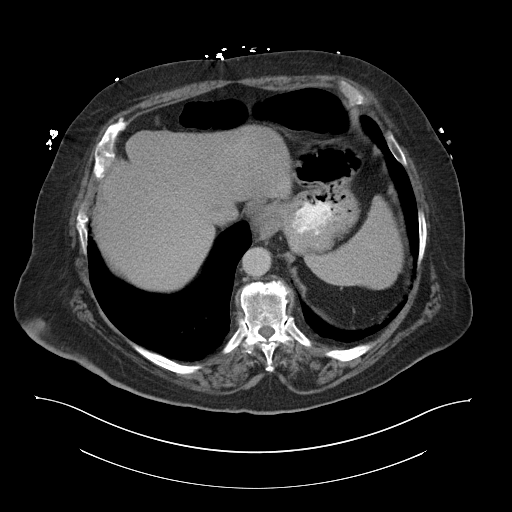
[im 80/85  soft-tissue]
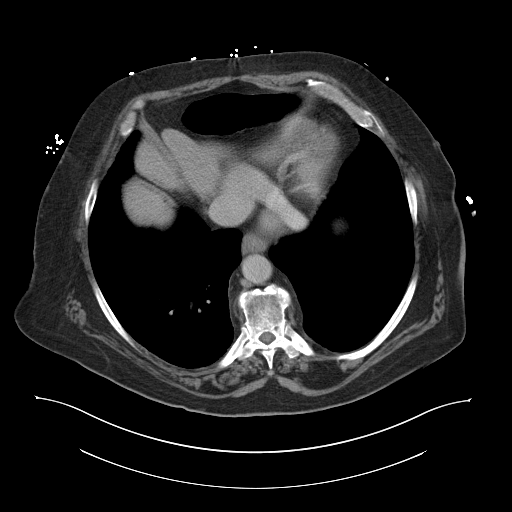

[Series 5: coronal a/|p · coronal · 0.88mm/px · 3 of 116 slices shown]
[im 39/116  soft-tissue]
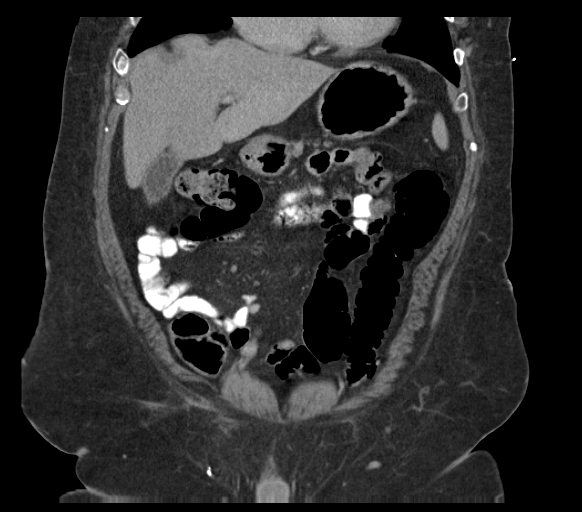
[im 52/116  soft-tissue]
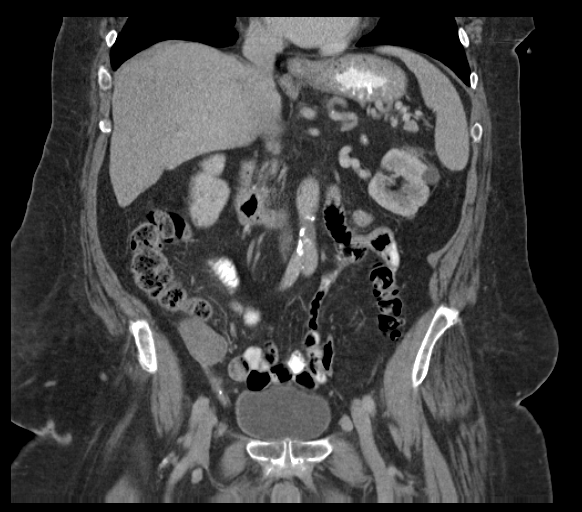
[im 64/116  soft-tissue]
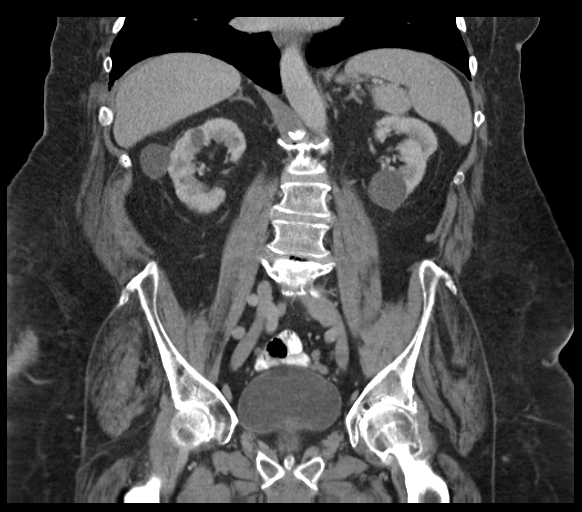

[16 of 46 positions shown; findings below may reference images not displayed]

FINDINGS: Minimal dependent atelectasis.

Liver, spleen, and adrenal glands are within normal limits.

Nonspecific calcifications in the pancreatic body are slightly more
prominent. There is no associated mass. This likely is related to
chronic pancreatitis.

Gallstones are present. The gallbladder is not distended but there
is some thickening of the gallbladder wall.

Several hypodensities are scattered throughout the kidneys. The
larger ones are characterized as simple cysts. Small ones are too
small to characterize. These are more prominent than on the prior
study from 9 years prior.

Indeterminate complex lesion deep to the cecum is smaller supporting
benign etiology.

Bladder and prostate are within normal limits.

No free-fluid.  No abnormal adenopathy.

There are calcifications within the left renal vein. These are
stable and likely reflect sequelae of prior DVT. This is a chronic
finding. Collateral venous structures are seen extending to the
splenic vein.

No vertebral compression deformity. Advanced degenerative changes in
the lumbar spine.
IMPRESSION: Gallstones. There is nonspecific gallbladder wall thickening. The
gallbladder is nondistended. Ultrasound can be performed as
clinically indicated.

Chronic changes.
